# Patient Record
Sex: Male | Born: 1955 | Race: White | Hispanic: No | Marital: Married | Smoking: Former smoker
Health system: Southern US, Community
[De-identification: ages and names within clinical notes are randomized; demographics above are authoritative.]

## PROBLEM LIST (undated history)

## (undated) DIAGNOSIS — E785 Hyperlipidemia, unspecified: Secondary | ICD-10-CM

## (undated) DIAGNOSIS — K5730 Diverticulosis of large intestine without perforation or abscess without bleeding: Secondary | ICD-10-CM

## (undated) DIAGNOSIS — I2510 Atherosclerotic heart disease of native coronary artery without angina pectoris: Secondary | ICD-10-CM

## (undated) DIAGNOSIS — I10 Essential (primary) hypertension: Secondary | ICD-10-CM

## (undated) HISTORY — DX: Diverticulosis of large intestine without perforation or abscess without bleeding: K57.30

## (undated) HISTORY — DX: Hyperlipidemia, unspecified: E78.5

## (undated) HISTORY — PX: OTHER SURGICAL HISTORY: SHX169

## (undated) HISTORY — DX: Atherosclerotic heart disease of native coronary artery without angina pectoris: I25.10

## (undated) HISTORY — DX: Essential (primary) hypertension: I10

---

## 2005-07-12 ENCOUNTER — Ambulatory Visit: Payer: Self-pay | Admitting: Family Medicine

## 2005-09-15 ENCOUNTER — Ambulatory Visit: Payer: Self-pay | Admitting: Family Medicine

## 2005-09-20 ENCOUNTER — Ambulatory Visit: Payer: Self-pay | Admitting: Family Medicine

## 2006-03-01 ENCOUNTER — Emergency Department (HOSPITAL_COMMUNITY): Admission: EM | Admit: 2006-03-01 | Discharge: 2006-03-01 | Payer: Self-pay | Admitting: Emergency Medicine

## 2006-03-02 ENCOUNTER — Ambulatory Visit: Payer: Self-pay | Admitting: Family Medicine

## 2006-03-03 ENCOUNTER — Ambulatory Visit: Payer: Self-pay | Admitting: Family Medicine

## 2006-03-06 ENCOUNTER — Ambulatory Visit: Payer: Self-pay | Admitting: Family Medicine

## 2007-03-31 DIAGNOSIS — I10 Essential (primary) hypertension: Secondary | ICD-10-CM | POA: Insufficient documentation

## 2007-03-31 DIAGNOSIS — K5730 Diverticulosis of large intestine without perforation or abscess without bleeding: Secondary | ICD-10-CM | POA: Insufficient documentation

## 2007-03-31 DIAGNOSIS — E785 Hyperlipidemia, unspecified: Secondary | ICD-10-CM | POA: Insufficient documentation

## 2007-04-02 ENCOUNTER — Ambulatory Visit: Payer: Self-pay | Admitting: Family Medicine

## 2007-04-02 DIAGNOSIS — R079 Chest pain, unspecified: Secondary | ICD-10-CM | POA: Insufficient documentation

## 2007-04-17 ENCOUNTER — Ambulatory Visit: Payer: Self-pay | Admitting: Internal Medicine

## 2007-05-21 ENCOUNTER — Ambulatory Visit: Payer: Self-pay | Admitting: Family Medicine

## 2007-05-21 LAB — CONVERTED CEMR LAB
AST: 31 units/L (ref 0–37)
Albumin: 4.2 g/dL (ref 3.5–5.2)
Basophils Absolute: 0 10*3/uL (ref 0.0–0.1)
Bilirubin Urine: NEGATIVE
Bilirubin, Direct: 0.1 mg/dL (ref 0.0–0.3)
Blood in Urine, dipstick: NEGATIVE
Calcium: 10.1 mg/dL (ref 8.4–10.5)
Chloride: 107 meq/L (ref 96–112)
Cholesterol: 168 mg/dL (ref 0–200)
Eosinophils Absolute: 0.1 10*3/uL (ref 0.0–0.6)
GFR calc Af Amer: 90 mL/min
GFR calc non Af Amer: 74 mL/min
Glucose, Urine, Semiquant: NEGATIVE
HDL: 50.8 mg/dL (ref >39.0)
Ketones, urine, test strip: NEGATIVE
Lymphocytes Relative: 26 % (ref 12.0–46.0)
MCHC: 34.5 g/dL (ref 30.0–36.0)
MCV: 95.9 fL (ref 78.0–100.0)
Neutro Abs: 4.5 10*3/uL (ref 1.4–7.7)
Neutrophils Relative %: 64.9 % (ref 43.0–77.0)
PSA: 0.48 ng/mL (ref 0.10–4.00)
Platelets: 295 10*3/uL (ref 150–400)
Protein, U semiquant: NEGATIVE
RBC: 3.83 M/uL — ABNORMAL LOW (ref 4.22–5.81)
Sodium: 143 meq/L (ref 135–145)
Specific Gravity, Urine: 1.015
Total CHOL/HDL Ratio: 3.3
Triglycerides: 105 mg/dL (ref 0–149)

## 2007-05-28 ENCOUNTER — Ambulatory Visit: Payer: Self-pay | Admitting: Family Medicine

## 2008-02-05 ENCOUNTER — Telehealth: Payer: Self-pay | Admitting: *Deleted

## 2008-03-03 ENCOUNTER — Ambulatory Visit: Payer: Self-pay | Admitting: Family Medicine

## 2008-03-03 DIAGNOSIS — T7840XA Allergy, unspecified, initial encounter: Secondary | ICD-10-CM | POA: Insufficient documentation

## 2008-08-05 ENCOUNTER — Ambulatory Visit: Payer: Self-pay | Admitting: Family Medicine

## 2008-08-05 DIAGNOSIS — J069 Acute upper respiratory infection, unspecified: Secondary | ICD-10-CM | POA: Insufficient documentation

## 2008-11-03 ENCOUNTER — Telehealth: Payer: Self-pay | Admitting: Family Medicine

## 2008-11-03 ENCOUNTER — Ambulatory Visit: Payer: Self-pay | Admitting: Family Medicine

## 2008-11-03 DIAGNOSIS — M79609 Pain in unspecified limb: Secondary | ICD-10-CM | POA: Insufficient documentation

## 2009-10-16 ENCOUNTER — Ambulatory Visit: Payer: Self-pay | Admitting: Internal Medicine

## 2009-10-16 DIAGNOSIS — I209 Angina pectoris, unspecified: Secondary | ICD-10-CM | POA: Insufficient documentation

## 2009-10-19 ENCOUNTER — Ambulatory Visit: Payer: Self-pay | Admitting: Cardiovascular Disease

## 2009-10-21 LAB — CONVERTED CEMR LAB
BUN: 17 mg/dL (ref 6–23)
Chloride: 103 meq/L (ref 96–112)
GFR calc non Af Amer: 73.69 mL/min (ref >60)
HCT: 42.2 % (ref 39.0–52.0)
Hemoglobin: 14.1 g/dL (ref 13.0–17.0)
MCV: 98.5 fL (ref 78.0–100.0)
Platelets: 269 10*3/uL (ref 150.0–400.0)
Potassium: 3.9 meq/L (ref 3.5–5.1)
RBC: 4.28 M/uL (ref 4.22–5.81)
Sodium: 139 meq/L (ref 135–145)
WBC: 13.8 10*3/uL — ABNORMAL HIGH (ref 4.5–10.5)
aPTT: 32.3 s — ABNORMAL HIGH (ref 21.7–28.8)

## 2009-10-22 ENCOUNTER — Ambulatory Visit (HOSPITAL_COMMUNITY): Admission: RE | Admit: 2009-10-22 | Discharge: 2009-10-23 | Payer: Self-pay | Admitting: Cardiovascular Disease

## 2009-10-22 ENCOUNTER — Ambulatory Visit: Payer: Self-pay | Admitting: Cardiovascular Disease

## 2009-10-22 DIAGNOSIS — I2510 Atherosclerotic heart disease of native coronary artery without angina pectoris: Secondary | ICD-10-CM

## 2009-10-22 HISTORY — DX: Atherosclerotic heart disease of native coronary artery without angina pectoris: I25.10

## 2009-10-26 ENCOUNTER — Encounter: Payer: Self-pay | Admitting: Cardiovascular Disease

## 2009-11-09 ENCOUNTER — Ambulatory Visit: Payer: Self-pay | Admitting: Cardiovascular Disease

## 2009-11-09 DIAGNOSIS — I2510 Atherosclerotic heart disease of native coronary artery without angina pectoris: Secondary | ICD-10-CM | POA: Insufficient documentation

## 2009-11-12 ENCOUNTER — Encounter: Payer: Self-pay | Admitting: Cardiovascular Disease

## 2009-12-15 ENCOUNTER — Ambulatory Visit: Payer: Self-pay | Admitting: Family Medicine

## 2009-12-16 ENCOUNTER — Ambulatory Visit: Payer: Self-pay | Admitting: Family Medicine

## 2009-12-25 ENCOUNTER — Ambulatory Visit (HOSPITAL_COMMUNITY): Admission: RE | Admit: 2009-12-25 | Discharge: 2009-12-25 | Payer: Self-pay | Admitting: Orthopaedic Surgery

## 2009-12-25 ENCOUNTER — Ambulatory Visit: Payer: Self-pay | Admitting: Vascular Surgery

## 2010-05-14 ENCOUNTER — Telehealth (INDEPENDENT_AMBULATORY_CARE_PROVIDER_SITE_OTHER): Payer: Self-pay | Admitting: Cardiology

## 2010-05-14 ENCOUNTER — Ambulatory Visit: Payer: Self-pay | Admitting: Cardiovascular Disease

## 2010-05-20 ENCOUNTER — Ambulatory Visit: Payer: Self-pay | Admitting: Family Medicine

## 2010-05-20 LAB — CONVERTED CEMR LAB
Bilirubin Urine: NEGATIVE
Blood in Urine, dipstick: NEGATIVE
Glucose, Urine, Semiquant: NEGATIVE
Ketones, urine, test strip: NEGATIVE
Protein, U semiquant: NEGATIVE
Specific Gravity, Urine: 1.015
WBC Urine, dipstick: NEGATIVE
pH: 7

## 2010-05-25 LAB — CONVERTED CEMR LAB
BUN: 15 mg/dL (ref 6–23)
Basophils Absolute: 0.1 10*3/uL (ref 0.0–0.1)
Bilirubin, Direct: 0.1 mg/dL (ref 0.0–0.3)
Cholesterol: 227 mg/dL — ABNORMAL HIGH (ref 0–200)
Creatinine, Ser: 1.1 mg/dL (ref 0.4–1.5)
Eosinophils Relative: 1.4 % (ref 0.0–5.0)
GFR calc non Af Amer: 72.02 mL/min (ref >60)
Glucose, Bld: 88 mg/dL (ref 70–99)
HCT: 41 % (ref 39.0–52.0)
HDL: 66.1 mg/dL (ref >39.00)
Lymphs Abs: 2.6 10*3/uL (ref 0.7–4.0)
MCV: 97.2 fL (ref 78.0–100.0)
Monocytes Absolute: 0.7 10*3/uL (ref 0.1–1.0)
Monocytes Relative: 9 % (ref 3.0–12.0)
Neutrophils Relative %: 54.7 % (ref 43.0–77.0)
PSA: 0.38 ng/mL (ref 0.10–4.00)
Platelets: 272 10*3/uL (ref 150.0–400.0)
Potassium: 4.4 meq/L (ref 3.5–5.1)
RDW: 13.6 % (ref 11.5–14.6)
TSH: 3.17 microintl units/mL (ref 0.35–5.50)
Total Bilirubin: 0.7 mg/dL (ref 0.3–1.2)
Triglycerides: 198 mg/dL — ABNORMAL HIGH (ref 0.0–149.0)
VLDL: 39.6 mg/dL (ref 0.0–40.0)
WBC: 7.7 10*3/uL (ref 4.5–10.5)

## 2010-07-09 ENCOUNTER — Ambulatory Visit: Payer: Self-pay | Admitting: Family Medicine

## 2010-07-09 LAB — CONVERTED CEMR LAB
Cholesterol: 225 mg/dL — ABNORMAL HIGH (ref 0–200)
HDL: 64.5 mg/dL (ref >39.00)
Triglycerides: 75 mg/dL (ref 0.0–149.0)

## 2010-07-20 ENCOUNTER — Ambulatory Visit: Payer: Self-pay | Admitting: Cardiovascular Disease

## 2010-07-29 DIAGNOSIS — I2510 Atherosclerotic heart disease of native coronary artery without angina pectoris: Secondary | ICD-10-CM

## 2010-07-29 HISTORY — DX: Atherosclerotic heart disease of native coronary artery without angina pectoris: I25.10

## 2010-08-11 ENCOUNTER — Inpatient Hospital Stay (HOSPITAL_COMMUNITY)
Admission: EM | Admit: 2010-08-11 | Discharge: 2010-08-12 | Payer: Self-pay | Source: Home / Self Care | Attending: Internal Medicine | Admitting: Internal Medicine

## 2010-08-13 ENCOUNTER — Telehealth: Payer: Self-pay | Admitting: Cardiovascular Disease

## 2010-08-19 ENCOUNTER — Encounter: Payer: Self-pay | Admitting: Physician Assistant

## 2010-08-27 ENCOUNTER — Encounter: Payer: Self-pay | Admitting: Family Medicine

## 2010-08-27 ENCOUNTER — Encounter: Payer: Self-pay | Admitting: Physician Assistant

## 2010-08-27 ENCOUNTER — Ambulatory Visit: Payer: Self-pay | Admitting: Physician Assistant

## 2010-08-27 DIAGNOSIS — R5383 Other fatigue: Secondary | ICD-10-CM

## 2010-08-27 DIAGNOSIS — R5381 Other malaise: Secondary | ICD-10-CM | POA: Insufficient documentation

## 2010-09-06 LAB — CONVERTED CEMR LAB
HCT: 38.4 % — ABNORMAL LOW (ref 39.0–52.0)
Hemoglobin: 13.2 g/dL (ref 13.0–17.0)
MCHC: 34.4 g/dL (ref 30.0–36.0)
MCV: 92.5 fL (ref 78.0–100.0)
Platelets: 323 10*3/uL (ref 150–400)
RDW: 12.8 % (ref 11.5–15.5)

## 2010-09-19 NOTE — H&P (Addendum)
Jeff Hawkins, Jeff Hawkins                 ACCOUNT NO.:  [id number]  MEDICAL RECORD NO.:  [id number]          PATIENT TYPE:  OBV  LOCATION:  2924                         FACILITY:  [facility]  PHYSICIAN:  Bevelyn Buckles. Bensimhon, MDDATE OF BIRTH:  13-Oct-1955  DATE OF ADMISSION:  08/11/2010 DATE OF DISCHARGE:                             HISTORY & PHYSICAL   PRIMARY CARDIOLOGIST:  Verne Carrow, MD  PRIMARY CARE PHYSICIAN:  Eugenio Hoes. Tawanna Cooler, MD  CHIEF COMPLAINTS:  Chest pain.  HISTORY OF PRESENT ILLNESS:  Jeff Hawkins is a 55 year old Caucasian male with a known history of coronary artery disease, having had a cardiac catheterization secondary to complaints of chest pain in February 2011, found to have a 99% proximal - mid LAD lesion with subsequent PCI using 3.5 x 18 mm Promus DES and no other significant coronary artery disease except for 80% ostial lesion and a small ramus intermedius, risk factors of hypertension, hyperlipidemia, and positive family history, who now presents with chest discomfort, first noted at work in the setting of significant stress.  The patient has been in his usual state of health which includes regular exercise running 30 minutes or so 3-4 times a week without complaint of chest pain or shortness of breath until today.  At work approximately 9:30 a.m., the patient noted the onset of mild left-sided chest discomfort that began to worsen while he was planning his mother-in- law's funeral arrangements.  The patient notes that at first he thought he might have some reflux or have some musculoskeletal pain, but as it continued to became more and more, concerned it might be a cardiac etiology, called his wife and was convinced to come home and take a break from work.  On his drive home, he felt hot although he was not diaphoretic.  He felt more and more short of breath and his chest pain worsened.  He rolled then his window with some relief.  By the time he got home, he  said he had severe left-sided chest discomfort.  The patient discussed these symptoms with both his wife and his son who is finishing his residency and planning to become a cardiologist and was instructed to call EMS ASAP.  The patient was then instructed to lie down and rest, but he felt like he could not.  He was too short of breath with lying down, so he got up which made him felt better. Eventually, he sat outside and waited for EMS.  Initial EKG on their arrival shows sinus tachycardia at 105 bpm, of note the patient is nearly always in low normal sinus or sinus bradycardia in the 50s.  He has no acute ST changes on that tracing or any other tracings completed since.  He was noted to be hypertensive, had systolic BP peak in the 160s and diastolic 110.  He took sublingual nitroglycerin with some relief and when he arrived to the emergency department, he was given nitro paste and reports more relief.  The blood pressure is ranging now from within normal limits with slightly elevated and pulse ranging in the 50s-70s.  Chest x-ray shows no active  disease.  Point-of-care markers negative.  Symptoms significantly improved but not totally resolved.  Note:  The patient, on July 20, 2010, instructed to decrease aspirin to low dose but continued full strength given large supply at home.  PAST MEDICAL HISTORY: 1. CAD.     a.     LHC, February 2011:  99% proximal to mid LAD, small ramus      intermedius with 80% ostial stenosis, circumflex without      significant disease, RCA dominant with serial 20% lesions.  Normal      left ventricular systolic function, EF 55%.     b.     S/P PCI with 3.5 x 18 mm Promus drug-eluting stent to      proximal - mid LAD lesion. 2. Hypertension. 3. Hyperlipidemia. 4. Colonic diverticulosis. 5. (Ear implant stapes replacement).  SOCIAL HISTORY:  The patient lives in [place] with his wife.  He has remote tobacco abuse, quitting in 2003 (13-pack-year  history), 1-2 drinks per day but no binge drinking.  No illicit drug use.  No herbal meds.  Generally follows heart-healthy diet with 2-3 indiscretions per week.  Exercises 3-4 times per week, 30 minutes at a time (running on elliptical).  FAMILY HISTORY:  Father diagnosed with coronary artery disease at age 58 and had CABG at that time.  Brother with diagnosis of CAD with PCI at age 72.  REVIEW OF SYSTEMS:  Please see HPI.  Also, the patient has a significant increased stress lately with his company being merged in an impending move to location as yet unknown at the first of the year.  His mother-in- law has also passed away recently and he has been planning her funeral arrangements.  She denies any exertional dyspnea or chest pain as above. He does note PND, and his wife describes him snoring.  She says that he will be snoring and have breaks in his breath and then sit up and gasp for air.  He did have some mild nausea today, but no vomiting and no recent episodes like this.  No bleeding or dark stools.  No fevers or chills.  No recent coughing or wheezing.  No lower extremity edema.  The patient had some tachy palpitations on his way home, otherwise no recent palpitations.  No syncope but did have mild presyncope during today's episode, otherwise no recent event like this.  No change in bowel or bladder.  No abdominal discomfort.  All other systems were reviewed and were negative.  CODE STATUS:  Full.  ALLERGIES:  Intolerant to BETA BLOCKERS (fatigue), otherwise all NKDA.  MEDICATIONS: 1. Enteric-coated aspirin 325 mg p.o. daily. 2. Crestor 20 mg p.o. daily. 3. Effient 10 mg p.o. daily. 4. Lisinopril 40 mg p.o. daily. 5. Norvasc 5 mg p.o. daily. 6. Sublingual glycerin 0.4 mg q.5 minutes up to 3 doses p.r.n. for     chest discomfort. 7. Multivitamin daily.  PHYSICAL EXAMINATION:  VITAL SIGNS:  Temp 98.2 degrees Fahrenheit, BP 136-144/85-87, peak while in the patient's room,  systolic in the 160s, peak diastolic 110, pulse 58-72, respiration rate 15-16, O2 saturation 96% on 2 liters by nasal cannula. GENERAL:  The patient is alert and oriented x3, in no apparent stress, able to speak easily without respiratory distress. HEENT:  Head is normocephalic, atraumatic.  Pupils equal, round, and reactive to light.  Extraocular muscles are intact.  Nares are patent. Dentition is good.  Oropharynx without erythema or exudate. NECK:  Supple without lymphadenopathy.  No thyromegaly.  No JVD.  No bruits. HEART:  Heart rate is regular with audible S1 and S2.  No clicks, rubs, murmurs, or gallops.  Pulses are 2+ and equal in both upper and lower extremities bilaterally. LUNGS:  Clear to auscultation bilaterally. SKIN:  No rashes, lesions, or petechiae. ABDOMEN:  Soft, nontender, nondistended.  Normal abdominal bowel sounds. No rebound or guarding.  No hepatosplenomegaly.  The patient is overweight. EXTREMITIES:  No clubbing, cyanosis, or edema. MUSCULOSKELETAL:  Without joint deformity or effusions.  No spine or CVA tenderness. NEUROLOGIC:  Cranial nerves II-XII grossly intact.  Strength was 5/5 in all extremities and axial groups.  Normal sensation throughout.  Normal cerebellar function.  RADIOLOGY: 1. Chest x-ray showed no active disease. 2. EKG, rhythm sinus tachycardia, 105 bpm, incomplete right bundle-     branch block, nonspecific ST-T wave changes.  No significant Q-     waves, normal axis, no evidence of hypertrophy.  PR 136, QRS 102,     and QTc of 420.  Compared to a tracing completed in February 2011,     there has been no significant changes except for the rate.  LABS:  WBC is 7.2 with normal differential, HGB 13.0, HCT 37.5, PLT count 275.  Sodium 140, potassium 4.1, chloride 104, bicarb 25, BUN 12, creatinine 1.01, glucose 105.  Point-of-care markers negative x1.  ASSESSMENT AND PLAN:  The patient was initially seen by Lenard Galloway, PAC, and then  seen and thoroughly assessed by attending cardiologist, Dr. Arvilla Meres.  Mr. Simien is a 55 year old Caucasian male with above-noted complex medical history including coronary artery disease, having had diagnostic cardiac catheterization in February 2011, revealing 99% proximal - mid LAD stenosis, subsequently getting 3.5 x 18 mm Promus DES to that lesion and no other significant CAD except for a small ramus intermedius branch (80% ostial lesion), risk factors including moderately well-controlled hypertension, hyperlipidemia, positive family history, and likely obstructive sleep apnea, who now presents with atypical chest discomfort with some typical features without any objective evidence for cardiac etiology in the setting of significant increased stress at home and work.  Suspect chest pain is likely secondary to severe anxiety.  As above, no objective evidence of ischemia.  Given recent stent, would bring in for rule out of myocardial infarction with serial cardiac enzymes. Discharged home with stress test ASAP as the patient has a race at [employer] this weekend and would hope to clear for this race if stress test normal.  Would consider starting Zoloft on discharge at 25 mg p.o. daily x3 days, then 50 mg p.o. daily x2 weeks, then 100 mg p.o. daily thereafter.     Jarrett Ables, PAC   ______________________________ Bevelyn Buckles. Bensimhon, MD    MS/MEDQ  D:  08/11/2010  T:  08/12/2010  Job:  [id number]  Electronically Signed by Jarrett Ables PAC on 09/08/2010 10:48:59 AM Electronically Signed by Arvilla Meres MD on 09/19/2010 07:09:25 PM

## 2010-09-22 ENCOUNTER — Encounter: Payer: Self-pay | Admitting: Pulmonary Disease

## 2010-09-22 ENCOUNTER — Ambulatory Visit (HOSPITAL_BASED_OUTPATIENT_CLINIC_OR_DEPARTMENT_OTHER)
Admission: RE | Admit: 2010-09-22 | Discharge: 2010-09-22 | Payer: Self-pay | Source: Home / Self Care | Attending: Physician Assistant | Admitting: Physician Assistant

## 2010-09-28 NOTE — Cardiovascular Report (Signed)
Summary: Cath/Percutaneous Orders  Cath/Percutaneous Orders   Imported By: Roderic Ovens 10/22/2009 15:46:10  _____________________________________________________________________  External Attachment:    Type:   Image     Comment:   External Document

## 2010-09-28 NOTE — Assessment & Plan Note (Signed)
Summary: Jeff Hawkins   Visit Type:  Jeff Hawkins Primary Provider:  Roderick Pee MD  CC:  epigastric pain....edema/ankles......  History of Present Illness: 55 yo WM with history of HTN, hyperlipidemia here today for follow up. He was seen as a new patient in February 2011 for further evaluation of chest pain. He described left sided chest pressure with jogging. Diagnostic left heart cath was performed on 10/22/09 and he was found to have a severe stenosis in the proximal to mid LAD.  A drug eluting stent was placed in the LAD. He is here today as an add on for evaluation of his blood pressure. He has been checking his BP at home and it has been as high as 160/110. He has overall been feeling well. Occasional heartburn. No exertional chest pain. His HR has been in the 40s on the Toprol and he has felt fatigued.   Lipids one week ago with total chol: 225, LDL 153. HDL 64.     Current Medications (verified): 1)  Aspirin Ec 325 Mg Tbec (Aspirin) .... Take One Tablet By Mouth Daily 2)  Zocor 40 Mg  Tabs (Simvastatin) .Marland Kitchen.. 1 Tab @ Bedtime 3)  Nitrostat 0.4 Mg Subl (Nitroglycerin) .Marland Kitchen.. 1 Tablet Under Tongue At Onset of Chest Pain; You May Repeat Every 5 Minutes For Up To 3 Doses. 4)  Effient 10 Mg Tabs (Prasugrel Hcl) .Marland Kitchen.. 1 Tab Once Daily 5)  Metoprolol Succinate 25 Mg Xr24h-Tab (Metoprolol Succinate) .... 1/2 Tab Every Other Day 6)  Centrum Cardio  Tabs (Multiple Vitamins-Minerals) .... As Needed 7)  Lisinopril 40 Mg Tabs (Lisinopril) .Marland Kitchen.. 1 Tab Once Daily  Allergies (verified): No Known Drug Allergies  Past History:  Past Medical History: Reviewed history from 11/09/2009 and no changes required. CAD, cath 10/22/09 with 99% proximal to mid LAD, s/p DES x 1.  Hyperlipidemia Hypertension Diverticulosis, colon Ear implant-stapes replacement  Social History: Reviewed history from 10/19/2009 and no changes required. Married, 4 children (ages 73-29) Remote tobacco 1990-2003. None fo r8 years.  Alcohol  use-yes, 1-2 drinks per day Drug use-no Regular exercise-yes  Review of Systems       The patient complains of heartburn.  The patient denies fatigue, malaise, fever, weight gain/loss, vision loss, decreased hearing, hoarseness, chest pain, palpitations, shortness of breath, prolonged cough, wheezing, sleep apnea, coughing up blood, abdominal pain, blood in stool, nausea, vomiting, diarrhea, incontinence, blood in urine, muscle weakness, joint pain, leg swelling, rash, skin lesions, headache, fainting, dizziness, depression, anxiety, enlarged lymph nodes, easy bruising or bleeding, and environmental allergies.    Vital Signs:  Patient profile:   55 year old male Height:      72 inches Weight:      210.8 pounds BMI:     28.69 Pulse rate:   62 / minute Pulse rhythm:   regular BP sitting:   132 / 84  (left arm) Cuff size:   large  Vitals Entered By: Danielle Rankin, CMA (July 20, 2010 3:12 PM)  Physical Exam  General:  General: Well developed, well nourished, NAD Musculoskeletal: Muscle strength 5/5 all ext Psychiatric: Mood and affect normal Neck: No JVD, no carotid bruits, no thyromegaly, no lymphadenopathy. Lungs:Clear bilaterally, no wheezes, rhonci, crackles CV: RRR no murmurs, gallops rubs Abdomen: soft, NT, ND, BS present Extremities: No edema, pulses 2+.    Impression & Recommendations:  Problem # 1:  CAD, NATIVE VESSEL (ICD-414.01) Stable. No exertional angina. Continue ASA but will lower to 81 mg per day. Will continue Effient  for now. Will d/c beta blocker as he is not tolerating well. Ace-inhibitor at current dose.  Will change statin to Crestor for better control.   The following medications were removed from the medication list:    Metoprolol Succinate 25 Mg Xr24h-tab (Metoprolol succinate) .Marland Kitchen... Take half tablet by mouth daily    Lisinopril 40 Mg Tabs (Lisinopril) .Marland Kitchen... 1 & 1/2 qam His updated medication list for this problem includes:    Aspirin 81 Mg Tbec  (Aspirin) .Marland Kitchen... Take one tablet by mouth daily    Nitrostat 0.4 Mg Subl (Nitroglycerin) .Marland Kitchen... 1 tablet under tongue at onset of chest pain; you may repeat every 5 minutes for up to 3 doses.    Effient 10 Mg Tabs (Prasugrel hcl) .Marland Kitchen... 1 tab once daily    Lisinopril 40 Mg Tabs (Lisinopril) .Marland Kitchen... 1 tab once daily    Amlodipine Besylate 5 Mg Tabs (Amlodipine besylate) .Marland Kitchen... Take one tablet by mouth daily  Problem # 2:  HYPERTENSION (ICD-401.9) Will d/c Toprol. Will add Norvasc 5 mg by mouth Qdaily for better Bp control.   The following medications were removed from the medication list:    Metoprolol Succinate 25 Mg Xr24h-tab (Metoprolol succinate) .Marland Kitchen... Take half tablet by mouth daily    Lisinopril 40 Mg Tabs (Lisinopril) .Marland Kitchen... 1 & 1/2 qam His updated medication list for this problem includes:    Aspirin 81 Mg Tbec (Aspirin) .Marland Kitchen... Take one tablet by mouth daily    Lisinopril 40 Mg Tabs (Lisinopril) .Marland Kitchen... 1 tab once daily    Amlodipine Besylate 5 Mg Tabs (Amlodipine besylate) .Marland Kitchen... Take one tablet by mouth daily  Problem # 3:  HYPERLIPIDEMIA (ICD-272.4) His goal LDL is 70. Most recent LDL of 153. Will d/c Zocor and will add Crestor 20 mg by mouth at bedtime. I will give him samples today to see how he tolerates it. Recheck lipids and LFTs in 12 weeks.   His updated medication list for this problem includes:    Crestor 20 Mg Tabs (Rosuvastatin calcium) .Marland Kitchen... Take one tablet by mouth daily.  Patient Instructions: 1)  Your physician recommends that you schedule a follow-up appointment in: 6 months 2)  Your physician recommends that you return for a FASTING lipid profile and liver function  in 12 weeks.  3)  Your physician has recommended you make the following change in your medication: STOP taking Metoprolol. DECREASE aspirin to 81mg  by mouth daily, ADD NORVASC 5mg  by mouth daily, and STOP ZOCOR and SWITCH to CRESTOR 20mg  by mouth daily.  Prescriptions: CRESTOR 20 MG TABS (ROSUVASTATIN CALCIUM)  Take one tablet by mouth daily.  #30 x 8   Entered by:   [name] RN   Authorized by:   Verne Carrow, MD   Signed by:   Ellender Hose RN on 07/20/2010   Method used:   Electronically to        [facility]. [address] [phone]* (retail)       [address]       [place], [place]  [address]       Ph: [phone] or [phone]       Fax: [phone]   RxID:   [id number] AMLODIPINE BESYLATE 5 MG TABS (AMLODIPINE BESYLATE) Take one tablet by mouth daily  #30 x 8   Entered by:   [name] RN   Authorized by:   Verne Carrow, MD   Signed by:   Whitney  Maeola Sarah RN on 07/20/2010   Method used:   Electronically to        [facility]. [address]. [phone]* (retail)       [address]       [place], [place]  [address]       Ph: [phone] or [phone]       Fax: [phone]   RxID:   [id number]

## 2010-09-28 NOTE — Assessment & Plan Note (Signed)
Summary: cpx/pt getting labs done by Cardiologist/cjr   Vital Signs:  Patient profile:   55 year old male Height:      72 inches Weight:      209 pounds Temp:     98.2 degrees F oral BP sitting:   142 / 92  (left arm) Cuff size:   regular  Vitals Entered By: Kern Reap CMA Duncan Dull) (May 20, 2010 3:01 PM) CC: cpx Is Patient Diabetic? No Pain Assessment Patient in pain? no        Primary Care Provider:  Roderick Pee MD  CC:  cpx.  History of Present Illness: Jeff Hawkins is a delightful, 55 year old, married male, nonsmoker, who comes in today for general physical examination because of underlying coronary disease, hypertension, hyperlipidemia.  His coronary disease is asymptomatic.  He runs to 3 miles a day, with no chest pain, and good exercise tolerance.  He is currently taking Effient 10 mg daily and an adult aspirin daily.  Is having a lot of trouble with bruising.  Advised to take the aspirin Monday, Wednesday, Friday.  For hypertension.  He takes a beta-blocker 12.5 mg daily, resting pulse today 70.  For hypertension.  He takes 40 mg of lisinopril daily.  BP 142/92, which consistent with 20 scanning at home.  We talked about options.  We will increase her lisinopril from 40 mg a day to 60.  Dipressure monitoring for the next 3 weeks at home to be sure his blood pressure drops and normal.  There is a fair amount of work stress.  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  Past Medical History: Reviewed history from 11/09/2009 and no changes required. CAD, cath 10/22/09 with 99% proximal to mid LAD, s/p DES x 1.  Hyperlipidemia Hypertension Diverticulosis, colon Ear implant-stapes  replacement  Past Surgical History: Reviewed history from 10/19/2009 and no changes required. ear surgeries  R 1986, L 2003 Right knee meniscus removal Left knee surgery remotely  Family History: Reviewed history from 10/19/2009 and no changes required. Family History of CAD Male 1st degree relative <50 Family History Diabetes 1st degree relative Family History Hypertension Mother deceased unknown causes, ? pneumonia Father deceased. H/O CAD, CABG at age 3. 4 sisters alive. No CAD 1 brother, younger. CAD with stent age 61  Social History: Reviewed history from 10/19/2009 and no changes required. Married, 4 children (ages 13-29) Remote tobacco 1990-2003. None fo r8 years.  Alcohol use-yes, 1-2 drinks per day Drug use-no Regular exercise-yes  Review of Systems      See HPI       Flu Vaccine Consent Questions     Do you have a history of severe allergic reactions to this vaccine? no    Any prior history of allergic reactions to egg and/or gelatin? no    Do you have a sensitivity to the preservative Thimersol? no    Do you have a past history of Guillan-Barre Syndrome? no    Do you currently have an acute febrile illness? no    Have you ever had a severe reaction to latex? no    Vaccine information given and explained to patient? yes    Are you currently pregnant? no  Lot Number:[id number]   Exp Date:02/26/2011   Site Given  Left Deltoid IM   Physical Exam  General:  Well-developed,well-nourished,in no acute distress; alert,appropriate and cooperative throughout examination Head:  Normocephalic and atraumatic without obvious abnormalities. No apparent alopecia or balding. Eyes:  No corneal or conjunctival inflammation noted. EOMI. Perrla. Funduscopic exam benign, without hemorrhages, exudates or papilledema. Vision grossly normal. Ears:  External ear exam shows no significant lesions or deformities.  Otoscopic examination reveals clear canals, tympanic membranes are  intact bilaterally without bulging, retraction, inflammation or discharge. Hearing is grossly normal bilaterally. Nose:  External nasal examination shows no deformity or inflammation. Nasal mucosa are pink and moist without lesions or exudates. Mouth:  Oral mucosa and oropharynx without lesions or exudates.  Teeth in good repair. Neck:  No deformities, masses, or tenderness noted. Chest Wall:  No deformities, masses, tenderness or gynecomastia noted. Breasts:  No masses or gynecomastia noted Lungs:  Normal respiratory effort, chest expands symmetrically. Lungs are clear to auscultation, no crackles or wheezes. Heart:  Normal rate and regular rhythm. S1 and S2 normal without gallop, murmur, click, rub or other extra sounds. Abdomen:  Bowel sounds positive,abdomen soft and non-tender without masses, organomegaly or hernias noted. Rectal:  No external abnormalities noted. Normal sphincter tone. No rectal masses or tenderness. Genitalia:  Testes bilaterally descended without nodularity, tenderness or masses. No scrotal masses or lesions. No penis lesions or urethral discharge. Prostate:  Prostate gland firm and smooth, no enlargement, nodularity, tenderness, mass, asymmetry or induration. Msk:  No deformity or scoliosis noted of thoracic or lumbar spine.   Pulses:  R and L carotid,radial,femoral,dorsalis pedis and posterior tibial pulses are full and equal bilaterally Extremities:  No clubbing, cyanosis, edema, or deformity noted with normal full range of motion of all joints.   Neurologic:  No cranial nerve deficits noted. Station and gait are normal. Plantar reflexes are down-going bilaterally. DTRs are symmetrical throughout. Sensory, motor and coordinative functions appear intact. Skin:  total body skin exam normal except for a red lesion on his nose.  Referred to dermatology Cervical Nodes:  No lymphadenopathy noted Axillary Nodes:  No palpable lymphadenopathy Inguinal Nodes:  No significant  adenopathy Psych:  Cognition and judgment appear intact. Alert and cooperative with normal attention span and concentration. No apparent delusions, illusions, hallucinations   Impression & Recommendations:  Problem # 1:  CAD, NATIVE VESSEL (ICD-414.01) Assessment Improved  His updated medication list for this problem includes:    Aspirin Ec 325 Mg Tbec (Aspirin) .Marland Kitchen... Take one tablet by mouth daily    Nitrostat 0.4 Mg Subl (Nitroglycerin) ..... One sublingually as needed for chest pain    Effient 10 Mg Tabs (Prasugrel hcl) .Marland Kitchen... 1 tab once daily    Metoprolol Succinate 25 Mg Xr24h-tab (Metoprolol succinate) .Marland Kitchen... Take half tablet by mouth daily    Lisinopril 40 Mg Tabs (Lisinopril) .Marland Kitchen... 1 & 1/2 qam  Orders: Venipuncture (16109) TLB-Lipid Panel (80061-LIPID) TLB-BMP (Basic Metabolic Panel-BMET) (80048-METABOL) TLB-CBC Platelet - w/Differential (85025-CBCD) TLB-Hepatic/Liver Function Pnl (80076-HEPATIC) TLB-TSH (Thyroid Stimulating Hormone) (84443-TSH) TLB-PSA (Prostate Specific Antigen) (84153-PSA) Prescription Created Electronically [phone]) UA Dipstick w/o Micro (automated)  (81003) Specimen Handling (09811)  Problem # 2:  PHYSICAL EXAMINATION (ICD-V70.0) Assessment: Unchanged  Orders: Venipuncture (91478) TLB-Lipid Panel (80061-LIPID) TLB-BMP (Basic Metabolic Panel-BMET) (80048-METABOL) TLB-CBC Platelet - w/Differential (85025-CBCD) TLB-Hepatic/Liver Function Pnl (80076-HEPATIC) TLB-TSH (Thyroid Stimulating Hormone) (84443-TSH) TLB-PSA (Prostate Specific Antigen) (84153-PSA) Prescription Created Electronically [phone]) UA Dipstick w/o Micro (automated)  (81003) Specimen Handling (13086)  Problem #  3:  HYPERTENSION (ICD-401.9) Assessment: Improved  His updated medication list for this problem includes:    Metoprolol Succinate 25 Mg Xr24h-tab (Metoprolol succinate) .Marland Kitchen... Take half tablet by mouth daily    Lisinopril 40 Mg Tabs (Lisinopril) .Marland Kitchen... 1 & 1/2  qam  Orders: Venipuncture (52841) TLB-Lipid Panel (80061-LIPID) TLB-BMP (Basic Metabolic Panel-BMET) (80048-METABOL) TLB-CBC Platelet - w/Differential (85025-CBCD) TLB-Hepatic/Liver Function Pnl (80076-HEPATIC) TLB-TSH (Thyroid Stimulating Hormone) (84443-TSH) TLB-PSA (Prostate Specific Antigen) (84153-PSA) Prescription Created Electronically [phone]) UA Dipstick w/o Micro (automated)  (81003)  Problem # 4:  HYPERLIPIDEMIA (ICD-272.4) Assessment: Improved  His updated medication list for this problem includes:    Zocor 40 Mg Tabs (Simvastatin) .Marland Kitchen... 1 tab @ bedtime  Orders: Venipuncture (10272) TLB-Lipid Panel (80061-LIPID) TLB-BMP (Basic Metabolic Panel-BMET) (80048-METABOL) TLB-CBC Platelet - w/Differential (85025-CBCD) TLB-Hepatic/Liver Function Pnl (80076-HEPATIC) TLB-TSH (Thyroid Stimulating Hormone) (84443-TSH) TLB-PSA (Prostate Specific Antigen) (84153-PSA) Prescription Created Electronically [phone]) UA Dipstick w/o Micro (automated)  (81003) Specimen Handling (40347)  Complete Medication List: 1)  Aspirin Ec 325 Mg Tbec (Aspirin) .... Take one tablet by mouth daily 2)  Zocor 40 Mg Tabs (Simvastatin) .Marland Kitchen.. 1 tab @ bedtime 3)  Nitrostat 0.4 Mg Subl (Nitroglycerin) .... One sublingually as needed for chest pain 4)  Effient 10 Mg Tabs (Prasugrel hcl) .Marland Kitchen.. 1 tab once daily 5)  Metoprolol Succinate 25 Mg Xr24h-tab (Metoprolol succinate) .... Take half tablet by mouth daily 6)  Centrum Cardio Tabs (Multiple vitamins-minerals) .... Take 1 tablet by mouth two times a day 7)  Lisinopril 40 Mg Tabs (Lisinopril) .Marland Kitchen.. 1 & 1/2 qam  Other Orders: Admin 1st Vaccine (42595) Flu Vaccine 60yrs + (63875)  Patient Instructions: 1)  Please schedule a follow-up appointment in 1 year. 2)  increase the lisinopril to 60 mg daily check a morning blood pressure daily for 3 weeks and fax me the day than at [phone]. 3)  Decrease the aspirin to one tablet Monday, Wednesday,  Friday Prescriptions: METOPROLOL SUCCINATE 25 MG XR24H-TAB (METOPROLOL SUCCINATE) take half tablet by mouth daily  #50 x 3   Entered and Authorized by:   Roderick Pee MD   Signed by:   Roderick Pee MD on 05/20/2010   Method used:   Electronically to        [facility] [address] [phone]* (retail)       [address]       [place], [place]  [address]       Ph: [phone] or [phone]       Fax: [phone]   RxID:   [id number] EFFIENT 10 MG TABS (PRASUGREL HCL) 1 tab once daily  #100 x 3   Entered and Authorized by:   Roderick Pee MD   Signed by:   Roderick Pee MD on 05/20/2010   Method used:   Electronically to        [facility] [address] [phone]* (retail)       [address]       [place], [place]  [address]       Ph: [phone] or [phone]       Fax: [phone]   RxID:   [id number] NITROSTAT 0.4 MG SUBL (NITROGLYCERIN) one sublingually as needed for chest pain  #25 x 1   Entered and Authorized by:   Roderick Pee MD   Signed by:   Roderick Pee MD on 05/20/2010  Method used:   Electronically to        [facility]. [address] [phone]* (retail)       [address]       [place], [place]  [address]       Ph: [phone] or [phone]       Fax: [phone]   RxID:   [id number] ZOCOR 40 MG  TABS (SIMVASTATIN) 1 tab @ bedtime  #100 x 3   Entered and Authorized by:   Roderick Pee MD   Signed by:   Roderick Pee MD on 05/20/2010   Method used:   Electronically to        [facility] [address] [phone]* (retail)       [address]       [place], [place]  [address]       Ph: [phone] or [phone]       Fax: [phone]   RxID:   [id number] LISINOPRIL 40 MG TABS (LISINOPRIL) 1 & 1/2 qam  #150 x 3   Entered and Authorized by:   Roderick Pee MD   Signed by:   Roderick Pee MD on 05/20/2010   Method used:   Electronically to        [facility] [address] [phone]*  (retail)       [address]       [place], [place]  [address]       Ph: [phone] or [phone]       Fax: [phone]   RxID:   [id number]     Laboratory Results   Urine Tests  Date/Time Recieved: May 20, 2010 5:23 PM  Date/Time Reported: May 20, 2010 5:23 PM   Routine Urinalysis   Color: yellow Appearance: Clear Glucose: negative   (Normal Range: Negative) Bilirubin: negative   (Normal Range: Negative) Ketone: negative   (Normal Range: Negative) Spec. Gravity: 1.015   (Normal Range: 1.003-1.035) Blood: negative   (Normal Range: Negative) pH: 7.0   (Normal Range: 5.0-8.0) Protein: negative   (Normal Range: Negative) Urobilinogen: 0.2   (Normal Range: 0-1) Nitrite: negative   (Normal Range: Negative) Leukocyte Esterace: negative   (Normal Range: Negative)    Comments: Wynona Canes, CMA  May 20, 2010 5:23 PM

## 2010-09-28 NOTE — Miscellaneous (Signed)
Summary: [facility] Progress Note  [facility] Note   Imported By: Roderic Ovens 12/02/2009 13:50:20  _____________________________________________________________________  External Attachment:    Type:   Image     Comment:   External Document

## 2010-09-28 NOTE — Assessment & Plan Note (Signed)
Summary: rov @ 4pm ok per pat/sl   Visit Type:  Follow-up Primary Provider:  Roderick Pee MD  CC:  no cardiac complaints today.  History of Present Illness: 55 yo WM with history of HTN, hyperlipidemia here today for follow up. He was seen as a new patient last month for further evaluation of chest pain. He described left sided chest pressure with jogging over the prior 4-6 weeks. Diagnostic left heart cath was performed on 10/22/09 and he was found to have a severe stenosis in the proximal to mid LAD.  A drug eluting stent was placed in the LAD. The right radial artery was used for access. He has done well since discharge. He has had no chest pain, SOB or palpitations. He has started back jogging without problems.   Current Medications (verified): 1)  Lisinopril 20 Mg Tabs (Lisinopril) .Marland Kitchen.. 1 Tab Once Daily 2)  Aspirin 81 Mg  Tbec (Aspirin) .... Once Daily 3)  Zocor 40 Mg  Tabs (Simvastatin) .Marland Kitchen.. 1 Tab @ Bedtime 4)  Nitrostat 0.4 Mg Subl (Nitroglycerin) .... One Sublingually As Needed For Chest Pain 5)  Effient 10 Mg Tabs (Prasugrel Hcl) .Marland Kitchen.. 1 Tab Once Daily  Allergies (verified): No Known Drug Allergies  Past History:  Past Medical History: CAD, cath 10/22/09 with 99% proximal to mid LAD, s/p DES x 1.  Hyperlipidemia Hypertension Diverticulosis, colon Ear implant-stapes replacement  Social History: Reviewed history from 10/19/2009 and no changes required. Married, 4 children (ages 16-29) Remote tobacco 1990-2003. None fo r8 years.  Alcohol use-yes, 1-2 drinks per day Drug use-no Regular exercise-yes  Review of Systems  The patient denies fatigue, malaise, fever, weight gain/loss, vision loss, decreased hearing, hoarseness, chest pain, palpitations, shortness of breath, prolonged cough, wheezing, sleep apnea, coughing up blood, abdominal pain, blood in stool, nausea, vomiting, diarrhea, heartburn, incontinence, blood in urine, muscle weakness, joint pain, leg swelling, rash,  skin lesions, headache, fainting, dizziness, depression, anxiety, enlarged lymph nodes, easy bruising or bleeding, and environmental allergies.    Vital Signs:  Patient profile:   55 year old male Height:      72 inches Weight:      200 pounds BMI:     27.22 Pulse rate:   58 / minute Pulse rhythm:   regular BP sitting:   110 / 80  (left arm) Cuff size:   large  Vitals Entered By: Danielle Rankin, CMA (November 09, 2009 3:37 PM)  Physical Exam  General:  General: Well developed, well nourished, NAD Psychiatric: Mood and affect normal Neck: No JVD, no carotid bruits, no thyromegaly, no lymphadenopathy. Lungs:Clear bilaterally, no wheezes, rhonci, crackles CV: RRR no murmurs, gallops rubs Abdomen: soft, NT, ND, BS present Extremities: No edema, pulses 2+.    Cardiac Cath  Procedure date:  10/22/2009  Findings:      ANGIOGRAPHIC FINDINGS: 1. The left main coronary artery had no disease. 2. The left anterior descending was a large-caliber vessel that     wrapped to the apex and gave off a moderate-to-large sized first     diagonal branch and a small second diagonal branch.  There was a     99% discrete stenosis in the proximal to mid vessel at the takeoff     of a moderate-sized septal perforator.  This did not involve the     moderate-sized diagonal branch.  There was 30-40% plaque just prior     to this discrete lesion in the proximal vessel.  The moderate-sized  first diagonal branch had no disease. 3. The circumflex artery was a large-caliber vessel that gave off     large obtuse marginal branch that was free of any disease.  The mid     AV groove circumflex had minor luminal irregularities.  There was a     ramus intermediate branch that was small in size and had an ostial     80% stenosis. 4. The right coronary artery was a large dominant vessel that had     serial 20% lesions throughout the proximal and midportions.  There     were no obstructive lesions in this  vessel. 5. Left ventricular angiogram was performed in the RAO projection     showed normal left ventricular systolic function with ejection     fraction of 55%.  No mitral regurgitation was noted.   IMPRESSION: 1. Single-vessel coronary artery disease. 2. Normal left ventricular systolic function. 3. Successful percutaneous transluminal coronary angioplasty with     placement of a drug-eluting stent in the proximal to mid left     anterior descending coronary artery.    Impression & Recommendations:  Problem # 1:  CAD, NATIVE VESSEL (ICD-414.01) Single vessel CAD s/p placement Promus DES in the proximal to mid LAD. He has done well. He has mild residual disease in other vessels. Will continue ASA and Effient for at least one year. Continue Lisinopril. Will start low dose beta blocker (Toprol XL 12.5mg  by mouth Qdaily). He was bradycardic in the hospital with heart rates in the 40's. I did not send him home on a beta blocker because of the bradycardia. His heart ratre is in the high 50's today so we will try. He will follow his pulse and if it is consistently less than 45 or any dizziness, will have him stop the Toprol. He is to continue advancing his activity level.   The following medications were removed from the medication list:    Metoprolol Succinate 50 Mg Xr24h-tab (Metoprolol succinate) ..... One daily His updated medication list for this problem includes:    Lisinopril 20 Mg Tabs (Lisinopril) .Marland Kitchen... 1 tab once daily    Aspirin Ec 325 Mg Tbec (Aspirin) .Marland Kitchen... Take one tablet by mouth daily    Nitrostat 0.4 Mg Subl (Nitroglycerin) ..... One sublingually as needed for chest pain    Effient 10 Mg Tabs (Prasugrel hcl) .Marland Kitchen... 1 tab once daily    Metoprolol Succinate 25 Mg Xr24h-tab (Metoprolol succinate) .Marland Kitchen... Take half tablet by mouth daily  Patient Instructions: 1)  Your physician recommends that you schedule a follow-up appointment in: 6 months 2)  Your physician has recommended you  make the following change in your medication: Start toprol 25 mg half tablet by mouth daily Prescriptions: METOPROLOL SUCCINATE 25 MG XR24H-TAB (METOPROLOL SUCCINATE) take half tablet by mouth daily  #15 x 11   Entered by:   Dossie Arbour, RN, BSN   Authorized by:   Verne Carrow, MD   Signed by:   Dossie Arbour, RN, BSN on 11/09/2009   Method used:   Electronically to        [facility]. [address] [phone]* (retail)       [address]       [place], [place]  [address]       Ph: [phone] or [phone]       Fax: [phone]   RxID:   [id number]

## 2010-09-28 NOTE — Progress Notes (Signed)
  Phone Note Outgoing Call   Call placed by: [name] RN,  May 14, 2010 4:05 PM Call placed to: Patient  Follow-up for Phone Call        Called pt. to discuss medication change. Increased Lisinopril to 40mg  by mouth daily.

## 2010-09-28 NOTE — Assessment & Plan Note (Signed)
Summary: per check out/sf   Visit Type:  Follow-up Primary Provider:  Roderick Pee MD  CC:  No cardiac complains.  History of Present Illness: 55 yo WM with history of HTN, hyperlipidemia here today for follow up. He was seen as a new patient in February 2011 for further evaluation of chest pain. He described left sided chest pressure with jogging. Diagnostic left heart cath was performed on 10/22/09 and he was found to have a severe stenosis in the proximal to mid LAD.  A drug eluting stent was placed in the LAD. He has had no chest pain, SOB or palpitations. He has started back jogging without problems. He has been under much stress at work with a pending move to the [place] to relocate his family.   Current Medications (verified): 1)  Lisinopril 20 Mg Tabs (Lisinopril) .Marland Kitchen.. 1 Tab Once Daily 2)  Aspirin Ec 325 Mg Tbec (Aspirin) .... Take One Tablet By Mouth Daily 3)  Zocor 40 Mg  Tabs (Simvastatin) .Marland Kitchen.. 1 Tab @ Bedtime 4)  Nitrostat 0.4 Mg Subl (Nitroglycerin) .... One Sublingually As Needed For Chest Pain 5)  Effient 10 Mg Tabs (Prasugrel Hcl) .Marland Kitchen.. 1 Tab Once Daily 6)  Metoprolol Succinate 25 Mg Xr24h-Tab (Metoprolol Succinate) .... Take Half Tablet By Mouth Daily 7)  Centrum Cardio  Tabs (Multiple Vitamins-Minerals) .... Take 1 Tablet By Mouth Two Times A Day  Allergies (verified): No Known Drug Allergies  Past History:  Past Medical History: Reviewed history from 11/09/2009 and no changes required. CAD, cath 10/22/09 with 99% proximal to mid LAD, s/p DES x 1.  Hyperlipidemia Hypertension Diverticulosis, colon Ear implant-stapes replacement  Social History: Reviewed history from 10/19/2009 and no changes required. Married, 4 children (ages 32-29) Remote tobacco 1990-2003. None fo r8 years.  Alcohol use-yes, 1-2 drinks per day Drug use-no Regular exercise-yes  Review of Systems  The patient denies fatigue, malaise, fever, weight gain/loss, vision loss, decreased hearing,  hoarseness, chest pain, palpitations, shortness of breath, prolonged cough, wheezing, sleep apnea, coughing up blood, abdominal pain, blood in stool, nausea, vomiting, diarrhea, heartburn, incontinence, blood in urine, muscle weakness, joint pain, leg swelling, rash, skin lesions, headache, fainting, dizziness, depression, anxiety, enlarged lymph nodes, easy bruising or bleeding, and environmental allergies.    Vital Signs:  Patient profile:   55 year old male Height:      72 inches Weight:      208 pounds BMI:     28.31 Pulse rate:   58 / minute Pulse rhythm:   regular Resp:     18 per minute BP sitting:   170 / 100  (left arm) Cuff size:   large  Vitals Entered By: Vikki Ports (May 14, 2010 3:14 PM)  Physical Exam  General:  General: Well developed, well nourished, NAD HEENT: OP clear, mucus membranes moist Musculoskeletal: Muscle strength 5/5 all ext Psychiatric: Mood and affect normal Neck: No JVD, no carotid bruits, no thyromegaly, no lymphadenopathy. Lungs:Clear bilaterally, no wheezes, rhonci, crackles CV: RRR no murmurs, gallops rubs Abdomen: soft, NT, ND, BS present Extremities: No edema, pulses 2+.    EKG  Procedure date:  05/14/2010  Findings:      Sinus bradycardia, rate 58 bpm. Incomplete RBBB  Impression & Recommendations:  Problem # 1:  CAD, NATIVE VESSEL (ICD-414.01) Stable. No angina. He is tolerating his medications well. He is bradycardic on low dose Toprol but asymptomatic.  No changes. Continue dual antiplatelet therapy with ASA and Effient.   His updated  medication list for this problem includes:    Lisinopril 20 Mg Tabs (Lisinopril) .Marland Kitchen... 1 tab once daily    Aspirin Ec 325 Mg Tbec (Aspirin) .Marland Kitchen... Take one tablet by mouth daily    Nitrostat 0.4 Mg Subl (Nitroglycerin) ..... One sublingually as needed for chest pain    Effient 10 Mg Tabs (Prasugrel hcl) .Marland Kitchen... 1 tab once daily    Metoprolol Succinate 25 Mg Xr24h-tab (Metoprolol succinate)  .Marland Kitchen... Take half tablet by mouth daily    Lisinopril 40 Mg Tabs (Lisinopril) .Marland Kitchen... Take one tablet by mouth daily  Problem # 2:  HYPERTENSION (ICD-401.9) Uncontrolled. Will increase Lisinopril to 40 mg per day. Will have him come back in one week for BMET, fasting lipids and LFTs.   His updated medication list for this problem includes:    Lisinopril 20 Mg Tabs (Lisinopril) .Marland Kitchen... 1 tab once daily    Aspirin Ec 325 Mg Tbec (Aspirin) .Marland Kitchen... Take one tablet by mouth daily    Metoprolol Succinate 25 Mg Xr24h-tab (Metoprolol succinate) .Marland Kitchen... Take half tablet by mouth daily    Lisinopril 40 Mg Tabs (Lisinopril) .Marland Kitchen... Take one tablet by mouth daily  Patient Instructions: 1)  Your physician recommends that you schedule a follow-up appointment in: 6 months 2)  Your physician recommends that you return for a FASTING lipid profile, liver function test, and  BMP in 1 week. 3)  Your physician has recommended you make the following change in your medication: INCREASE your Lisinopril to 40mg  by mouth daily. Prescriptions: LISINOPRIL 40 MG TABS (LISINOPRIL) Take one tablet by mouth daily  #30 x 8   Entered by:   [name] RN   Authorized by:   Verne Carrow, MD   Signed by:   Ellender Hose RN on 05/14/2010   Method used:   Electronically to        [facility]. [address] [phone]* (retail)       [address]       [place], [place]  [address]       Ph: [phone] or [phone]       Fax: [phone]   RxID:   [id number]

## 2010-09-28 NOTE — Miscellaneous (Signed)
Summary: Physician Order/Treatment Plan   Physician Order/Treatment Plan   Imported By: Roderic Ovens 11/06/2009 16:19:35  _____________________________________________________________________  External Attachment:    Type:   Image     Comment:   External Document

## 2010-09-28 NOTE — Letter (Signed)
Summary: Cardiac Catheterization Instructions- Main Lab  [facility], Main Office  [address]   [place], [place] [address]   Phone: [phone]  Fax: [phone]     10/19/2009 MRN: [id number]  Jeff Hawkins [address] [place], [place]  [address]  Dear Jeff Hawkins,   You are scheduled for Cardiac Catheterization on    October 22, 2009           with Dr.      Clifton James      .  Please arrive at the [facility] at  6:30     a.m.Marland Kitchen on the day of your procedure.  1. DIET     __x__ Nothing to eat or drink after midnight except your medications with a sip of water.  2. Come to the [place] office on             for lab work.  The lab at [facility] is open from 8:30 a.m. to 1:30 p.m. and 2:30 p.m. to 5:00 p.m.  The lab at [facility] is open from 7:30 a.m. to 5:30 p.m.  You do not have to be fasting.  3. MAKE SURE YOU TAKE YOUR ASPIRIN.  4. _____ DO NOT TAKE these medications before your procedure:         ________________________________________________________________________________      ____ YOU MAY TAKE ALL of your remaining medications with a small amount of water.      ____ START NEW medications:     ________________________________________________________________________________      ____ Jeff Hawkins instructions:     ________________________________________________________________________________  5. Plan for one night stay - bring personal belongings (i.e. toothpaste, toothbrush, etc.)  6. Bring a current list of your medications and current insurance cards.  7. Must have a responsible person to drive you home.   8. Someone must be with yu for the first 24 hours after you arrive home.  9. Please wear clothes that are easy to get on and off and wear slip-on shoes.  *Special note: Every effort is made to have your procedure done on time.  Occasionally there are emergencies that present themselves at  the hospital that may cause delays.  Please be patient if a delay does occur.  If you have any questions after you get home, please call the office at the number listed above.  Jeff Arbour, RN, BSN

## 2010-09-28 NOTE — Assessment & Plan Note (Signed)
Summary: Jeff Hawkins   Visit Type:  new pt visit Primary Provider:  Roderick Pee MD  CC:  chest pain raidiating down left arm...edema/hands.  History of Present Illness: 55 yo WM with history of HTN, hyperlipidemia here today for further evaluatoin of chest pain. He describes left sided chest pressure with jogging over the last 4-6 weeks. The pain is described as a heaviness or pressure. There is radiatoin into the left shoulder and arm. This resolves with rest. There is no associated SOB, palpitations. He does feel dizzy at times. He wears a heart rate monitor and this pain usually starts when his heart rate reaches 120 beats per minute. He had an exercise only stress test 2 years ago and had no problems. He reports good control of his blood pressure and cholesterol.   Old records reviewed.   Current Medications (verified): 1)  Hydrochlorothiazide 25 Mg Tabs (Hydrochlorothiazide) .Marland Kitchen.. 1 Once Daily 2)  Lisinopril 40 Mg Tabs (Lisinopril) .Marland Kitchen.. 1 Once Daily 3)  Aspirin 81 Mg  Tbec (Aspirin) .... Once Daily 4)  Zocor 40 Mg  Tabs (Simvastatin) .Marland Kitchen.. 1 Tab @ Bedtime 5)  Metoprolol Succinate 50 Mg Xr24h-Tab (Metoprolol Succinate) .... One Daily 6)  Nitrostat 0.4 Mg Subl (Nitroglycerin) .... One Sublingually As Needed For Chest Pain  Allergies (verified): No Known Drug Allergies  Past History:  Past Medical History: Hyperlipidemia Hypertension Diverticulosis, colon Ear implant-stapes replacement  Past Surgical History: ear surgeries  R 1986, L 2003 Right knee meniscus removal Left knee surgery remotely  Family History: Family History of CAD Male 1st degree relative <50 Family History Diabetes 1st degree relative Family History Hypertension Mother deceased unknown causes, ? pneumonia Father deceased. H/O CAD, CABG at age 8. 4 sisters alive. No CAD 1 brother, younger. CAD with stent age 79  Social History: Married, 4 children (ages 46-29) Remote tobacco 1990-2003.  None fo r8 years.  Alcohol use-yes, 1-2 drinks per day Drug use-no Regular exercise-yes  Review of Systems       The patient complains of chest pain and dizziness.  The patient denies fatigue, malaise, fever, weight gain/loss, vision loss, decreased hearing, hoarseness, palpitations, shortness of breath, prolonged cough, wheezing, sleep apnea, coughing up blood, abdominal pain, blood in stool, nausea, vomiting, diarrhea, heartburn, incontinence, blood in urine, muscle weakness, joint pain, leg swelling, rash, skin lesions, headache, fainting, depression, anxiety, enlarged lymph nodes, easy bruising or bleeding, and environmental allergies.    Vital Signs:  Patient profile:   55 year old male Height:      72 inches Weight:      202 pounds BMI:     27.50 Pulse rate:   47 / minute Pulse rhythm:   irregular BP sitting:   102 / 70  (left arm) Cuff size:   large  Vitals Entered By: Danielle Rankin, CMA (October 19, 2009 3:41 PM)  Physical Exam  General:  General: Well developed, well nourished, NAD HEENT: OP clear, mucus membranes moist SKIN: warm, dry Neuro: No focal deficits Musculoskeletal: Muscle strength 5/5 all ext Psychiatric: Mood and affect normal Neck: No JVD, no carotid bruits, no thyromegaly, no lymphadenopathy. Lungs:Clear bilaterally, no wheezes, rhonci, crackles CV: RRR no murmurs, gallops rubs Abdomen: soft, NT, ND, BS present Extremities: No edema, pulses 2+.    EKG  Procedure date:  10/19/2009  Findings:      Sinus bradycardia, rate 47 bpm. Incomplete RBBB. Septal Q waves, possible septal infarction.   Impression & Recommendations:  Problem # 1:  CHEST PAIN (ICD-786.50) This patient has symptoms that  are concerning for unstable angina. He has a history of HTN and hyperlipidemia with recent onset of exertional chest pressure that resolves with rest. He also has a strong family history of CAD. I will arrange a diagnostic left heart cath for Thursday February  24th at 8:30 am in the main cath lab. There is a high probability that he will have obstructive disease. I have reviewed the risks and benefits of the procedure. He is in agreement to proceed. We will check BMET, CBC and coags today.   His updated medication list for this problem includes:    Lisinopril 40 Mg Tabs (Lisinopril) .Marland Kitchen... 1 once daily    Aspirin 81 Mg Tbec (Aspirin) ..... Once daily    Metoprolol Succinate 50 Mg Xr24h-tab (Metoprolol succinate) ..... One daily    Nitrostat 0.4 Mg Subl (Nitroglycerin) ..... One sublingually as needed for chest pain  Orders: EKG w/ Interpretation (93000) TLB-BMP (Basic Metabolic Panel-BMET) (80048-METABOL) TLB-CBC Platelet - w/Differential (85025-CBCD) TLB-PT (Protime) (85610-PTP) TLB-PTT (85730-PTTL)  Patient Instructions: 1)  Your physician recommends that you schedule a follow-up appointment in: 3 weeks 2)  Your physician has requested that you have a cardiac catheterization.  Cardiac catheterization is used to diagnose and/or treat various heart conditions. Doctors may recommend this procedure for a number of different reasons. The most common reason is to evaluate chest pain. Chest pain can be a symptom of coronary artery disease (CAD), and cardiac catheterization can show whether plaque is narrowing or blocking your heart's arteries. This procedure is also used to evaluate the valves, as well as measure the blood flow and oxygen levels in different parts of your heart.  For further information please visit [email or web address]  Please follow instruction sheet, as given.

## 2010-09-28 NOTE — Assessment & Plan Note (Signed)
Summary: pain in leg muscles/cjr   Vital Signs:  Patient profile:   55 year old male Weight:      204 pounds Temp:     98.1 degrees F oral BP sitting:   152 / 90  (left arm) Cuff size:   regular  Vitals Entered By: Kern Reap CMA Duncan Dull) (December 15, 2009 4:08 PM) CC: right calf pain   Primary Care Provider:  Roderick Pee MD  CC:  right calf pain.  History of Present Illness: Jeff Hawkins is a 55 year old male, who comes in today for evaluation of pain in his right lower extremity for 10 days.  On April the 10th Sunday he get up and went for his normal job.  He noticed pain in the right mid tibia.  It was so painful.  He had to stop.  No history of trauma.  He's not been able to run since then because of the pain.  He is on effient because he had a stent put in.  No history of blood clots, chest pain, shortness of breath, etc.  Allergies: No Known Drug Allergies  Family History: Reviewed history from 10/19/2009 and no changes required. Family History of CAD Male 1st degree relative <50 Family History Diabetes 1st degree relative Family History Hypertension Mother deceased unknown causes, ? pneumonia Father deceased. H/O CAD, CABG at age 36. 4 sisters alive. No CAD 1 brother, younger. CAD with stent age 62  Social History: Reviewed history from 10/19/2009 and no changes required. Married, 4 children (ages 10-29) Remote tobacco 1990-2003. None fo r8 years.  Alcohol use-yes, 1-2 drinks per day Drug use-no Regular exercise-yes  Review of Systems      See HPI  Physical Exam  General:  Well-developed,well-nourished,in no acute distress; alert,appropriate and cooperative throughout examination Msk:  the legs appear normal.  There is some tenderness in the mid shaft of the tibia medially.  No calf tenderness.  No redness.  No heat Pulses:  R and L carotid,radial,femoral,dorsalis pedis and posterior tibial pulses are full and equal bilaterally Extremities:  No clubbing,  cyanosis, edema, or deformity noted with normal full range of motion of all joints.     Impression & Recommendations:  Problem # 1:  LEG PAIN, RIGHT (ICD-729.5) Assessment New  Orders: T-Tib/Fib Right (73590TC)  Complete Medication List: 1)  Lisinopril 20 Mg Tabs (Lisinopril) .Marland Kitchen.. 1 tab once daily 2)  Aspirin Ec 325 Mg Tbec (Aspirin) .... Take one tablet by mouth daily 3)  Zocor 40 Mg Tabs (Simvastatin) .Marland Kitchen.. 1 tab @ bedtime 4)  Nitrostat 0.4 Mg Subl (Nitroglycerin) .... One sublingually as needed for chest pain 5)  Effient 10 Mg Tabs (Prasugrel hcl) .Marland Kitchen.. 1 tab once daily 6)  Metoprolol Succinate 25 Mg Xr24h-tab (Metoprolol succinate) .... Take half tablet by mouth daily 7)  Vicodin Es 7.5-750 Mg Tabs (Hydrocodone-acetaminophen) .... 1/2 to 1 q 4h as needed  Patient Instructions: 1)  elevation and ice 15 minutes 3 or 4 times per day.  He may take a half of a Vicodin every 4 to 6 hours as needed for severe pain.  Go to the main office in the morning at 9 a.m. for an x-ray of the leg.  I will call u   The report Prescriptions: VICODIN ES 7.5-750 MG TABS (HYDROCODONE-ACETAMINOPHEN) 1/2 to 1 q 4h as needed  #30 x 1   Entered and Authorized by:   Roderick Pee MD   Signed by:   Roderick Pee MD on  12/15/2009   Method used:   Print then Give to Patient   RxID:   [id number]

## 2010-09-28 NOTE — Assessment & Plan Note (Signed)
Summary: consult re: left shoulder injury/cjr   Vital Signs:  Patient profile:   55 year old male Weight:      204 pounds Temp:     98.6 degrees F BP sitting:   120 / 70  (right arm) Cuff size:   regular  Vitals Entered By: Duard Brady LPN (October 16, 2009 8:17 AM) CC: c.o numbness and tingling in (l) left arm with pain in chest and sob while running Is Patient Diabetic? No   CC:  c.o numbness and tingling in (l) left arm with pain in chest and sob while running.  History of Present Illness: 55 -year-old who has a history of hypertension, and dyslipidemia.  He has a family history of coronary artery disease-his father.  History of CABG.  For the past 3 weeks, he has had predictable chest pain with jogging, but is relieved by rest.  Pain is described as both achiness and tightness with radiation to left arm.  No nausea, dyspnea or diaphoresis.  He did have a negative treadmill ETT approximately 2 years ago.  Preventive Screening-Counseling & Management  Alcohol-Tobacco     Smoking Status: quit  Allergies (verified): No Known Drug Allergies  Past History:  Past Medical History: Reviewed history from 03/31/2007 and no changes required. Hyperlipidemia Hypertension Diverticulosis, colon  Social History: Smoking Status:  quit  Review of Systems       The patient complains of chest pain.  The patient denies anorexia, fever, weight loss, weight gain, vision loss, decreased hearing, hoarseness, syncope, dyspnea on exertion, peripheral edema, prolonged cough, headaches, hemoptysis, abdominal pain, melena, hematochezia, severe indigestion/heartburn, hematuria, incontinence, genital sores, muscle weakness, suspicious skin lesions, transient blindness, difficulty walking, depression, unusual weight change, abnormal bleeding, enlarged lymph nodes, angioedema, breast masses, and testicular masses.    Physical Exam  General:  Well-developed,well-nourished,in no acute distress;  alert,appropriate and cooperative throughout examination; 122/78 Head:  Normocephalic and atraumatic without obvious abnormalities. No apparent alopecia or balding. Eyes:  No corneal or conjunctival inflammation noted. EOMI. Perrla. Funduscopic exam benign, without hemorrhages, exudates or papilledema. Vision grossly normal. Mouth:  Oral mucosa and oropharynx without lesions or exudates.  Teeth in good repair. Neck:  No deformities, masses, or tenderness noted. Chest Wall:  chest wall pain Lungs:  Normal respiratory effort, chest expands symmetrically. Lungs are clear to auscultation, no crackles or wheezes. Heart:  Normal rate and regular rhythm. S1 and S2 normal without gallop, murmur, click, rub or other extra sounds. Abdomen:  Bowel sounds positive,abdomen soft and non-tender without masses, organomegaly or hernias noted. Msk:  No deformity or scoliosis noted of thoracic or lumbar spine.   Pulses:  R and L carotid,radial,femoral,dorsalis pedis and posterior tibial pulses are full and equal bilaterally Extremities:  No clubbing, cyanosis, edema, or deformity noted with normal full range of motion of all joints.   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 1 once daily    Lisinopril 40 Mg Tabs (Lisinopril) .Marland Kitchen... 1 once daily    Aspirin 81 Mg Tbec (Aspirin) ..... Once daily    Metoprolol Succinate 50 Mg Xr24h-tab (Metoprolol succinate) ..... One daily    Nitrostat 0.4 Mg Subl (Nitroglycerin) ..... One sublingually as needed for chest pain this appears to be classic angina pectoris.  Will continue daily aspirin,  limit his activity, placed  on beta-blocker therapy, and give a prescription for nitroglycerin.  Will discuss with cardiology  who will consider a diagnostic heart catheterization; will  review a 12-lead EKG and a the right and live in a [facility] at [facility] and examined in the cardiologist is not in the area next week with new onset angina and policy every zero.  He [id number] yes lung.  There are no bruits orm and  His updated medication list for this problem includes:    Hydrochlorothiazide 25 Mg Tabs (Hydrochlorothiazide) .Marland Kitchen... 1 once daily    Lisinopril 40 Mg Tabs (Lisinopril) .Marland Kitchen... 1 once daily    Aspirin 81 Mg Tbec (Aspirin) ..... Once daily    Metoprolol Succinate 50 Mg Xr24h-tab (Metoprolol succinate) ..... One daily    Nitrostat 0.4 Mg Subl (Nitroglycerin) ..... One sublingually as needed for chest pain  Orders: EKG w/ Interpretation (93000)  Problem # 2:  HYPERTENSION (ICD-401.9)  His updated medication list for this problem includes:    Hydrochlorothiazide 25 Mg Tabs (Hydrochlorothiazide) .Marland Kitchen... 1 once daily    Lisinopril 40 Mg Tabs (Lisinopril) .Marland Kitchen... 1 once daily    Metoprolol Succinate 50 Mg Xr24h-tab (Metoprolol succinate) ..... One daily  His updated medication list for this problem includes:    Hydrochlorothiazide 25 Mg Tabs (Hydrochlorothiazide) .Marland Kitchen... 1 once daily    Lisinopril 40 Mg Tabs (Lisinopril) .Marland Kitchen... 1 once daily    Metoprolol Succinate 50 Mg Xr24h-tab (Metoprolol succinate) ..... One daily  Orders: EKG w/ Interpretation (93000)  Problem # 3:  HYPERLIPIDEMIA (ICD-272.4)  His updated medication list for this problem includes:    Zocor 40 Mg Tabs (Simvastatin) .Marland Kitchen... 1 tab @ bedtime  His updated medication list for this problem includes:    Zocor 40 Mg Tabs (Simvastatin) .Marland Kitchen... 1 tab @ bedtime  Complete Medication List: 1)  Hydrochlorothiazide 25 Mg Tabs (Hydrochlorothiazide) .Marland Kitchen.. 1 once daily 2)  Lisinopril 40 Mg Tabs (Lisinopril) .Marland Kitchen.. 1 once daily 3)  Aspirin 81 Mg Tbec (Aspirin) .... Once daily 4)  Zocor 40 Mg Tabs (Simvastatin) .Marland Kitchen.. 1 tab @ bedtime 5)  Metoprolol Succinate 50 Mg Xr24h-tab (Metoprolol succinate) .... One daily 6)  Nitrostat  0.4 Mg Subl (Nitroglycerin) .... One sublingually as needed for chest pain  Patient Instructions: 1)  cardiology appointment 3: 15 Monday, February 21 2)  no strenuous activity 3)  report any recurrent chest pain immediately and take nitroglycerin Prescriptions: METOPROLOL SUCCINATE 50 MG XR24H-TAB (METOPROLOL SUCCINATE) one daily  #30 x 0   Entered and Authorized by:   Gordy Savers  MD   Signed by:   Gordy Savers  MD on 10/16/2009   Method used:   Print then Give to Patient   RxID:   [id number] NITROSTAT 0.4 MG SUBL (NITROGLYCERIN) one sublingually as needed for chest pain  #bottle x 0   Entered and Authorized by:   Gordy Savers  MD   Signed by:   Gordy Savers  MD on 10/16/2009   Method used:   Print then Give to Patient   RxID:   [id number] METOPROLOL SUCCINATE 50 MG XR24H-TAB (METOPROLOL SUCCINATE) one daily  #30 x 0   Entered and Authorized by:   Gordy Savers  MD   Signed by:   Gordy Savers  MD on 10/16/2009   Method used:   Electronically to        [facility] [address] [phone]* (retail)       [address]  [place], [place]  [address]       Ph: [phone] or [phone]       Fax: [phone]   RxID:   [id number]

## 2010-09-30 NOTE — Progress Notes (Signed)
Summary: question on meds/***rtn call**  Phone Note Call from Patient Call back at [phone]   Caller: Patient Reason for Call: Talk to Nurse Summary of Call: Pt states dr Clifton James was going to give him some anit anxiety meds.  Initial call taken by: [name],  August 13, 2010 3:06 PM  Follow-up for Phone Call        [facility] Scherrie Bateman, LPN  August 13, 2010 4:01 PM  Additional Follow-up for Phone Call Additional follow up Details #1::        Can we start him on Zoloft 50 mg by mouth Qdaily. thanks, chris  LVMTCB [name] RN  August 16, 2010 2:11 PM  Additional Follow-up by: Verne Carrow, MD,  August 16, 2010 8:37 AM    Additional Follow-up for Phone Call Additional follow up Details #2::    pt is rtning call pls call him at [phone] so he will be able to answer  [name]  August 18, 2010 9:32 AM  adv pt of new prescription. he expressed understanding.  Follow-up by: Claris Gladden RN,  August 18, 2010 9:50 AM  New/Updated Medications: SERTRALINE HCL 50 MG TABS (SERTRALINE HCL) once daily Prescriptions: SERTRALINE HCL 50 MG TABS (SERTRALINE HCL) once daily  #30 x 11   Entered by:   Claris Gladden RN   Authorized by:   Verne Carrow, MD   Signed by:   Claris Gladden RN on 08/18/2010   Method used:   Electronically to        [facility] [address] [phone]* (retail)       [address]       [place], [place]  [address]       Ph: [phone] or [phone]       Fax: [phone]   RxID:   [id number]

## 2010-09-30 NOTE — Miscellaneous (Signed)
  Clinical Lists Changes  Observations: Added new observation of CARDCATHFIND: CONCLUSIONS: 1. Preserved overall left ventricular function with ejection fraction     in excess of 55% and no wall motion abnormality. 2. Continued wide patency of the left anterior descending stent. 3. Continued 80% narrowing of the ostium of the small first diagonal     branch. 4. Scattered coronary plaques including mid LAD, proximal mid right,     and distal right.   DISPOSITION:  Aggressive risk factor reduction is recommended.  He will follow up with Dr. Clifton James.  Because of his positive enzymes, we suggested discharge in the morning, the patient preferred to go home tonight and has asked that he be released.   (08/12/2010 11:45)      Cardiac Cath  Procedure date:  08/12/2010  Findings:      CONCLUSIONS: 1. Preserved overall left ventricular function with ejection fraction     in excess of 55% and no wall motion abnormality. 2. Continued wide patency of the left anterior descending stent. 3. Continued 80% narrowing of the ostium of the small first diagonal     branch. 4. Scattered coronary plaques including mid LAD, proximal mid right,     and distal right.   DISPOSITION:  Aggressive risk factor reduction is recommended.  He will follow up with Dr. Clifton James.  Because of his positive enzymes, we suggested discharge in the morning, the patient preferred to go home tonight and has asked that he be released.

## 2010-09-30 NOTE — Assessment & Plan Note (Signed)
Summary: eph   Visit Type:  [name] Primary Provider:  Roderick Pee MD  CC:  no cardiac complaints today.  History of Present Illness: Primary Cardiologist:  Dr. Verne Hawkins  Jeff Hawkins is a 55 yo male with a history of CAD, status post drug-eluting stent to the mid LAD in February 2011 who was recently admitted with chest pain.  He had elevated CK-MBs but his troponins were negative.  Cardiac catheterization demonstrated a patent stent in the LAD.  He had a septal perforator coming off of the stent with a stable 80% stenosis and an 80% stenosis in a small ramus intermediate.  Medical therapy was pursued.  LDL was 73 during his hospitalization.  Chest x-ray was negative.  He returns today for followup.  Overall, he is doing well.  He denies chest discomfort.  He denies shortness of breath.  He exercises on his elliptical machine daily without angina.  He denies syncope near syncope.  He denies orthopnea or PND.  He denies pedal edema.  He does report symptoms of fatigue.  He denies melena or hematochezia.  Recent TSH was normal.  His wife states that he does snore.  She has also witnessed apneic episodes.  He reports recent history of daytime hypersomnolence.  Current Medications (verified): 1)  Aspirin Ec 325 Mg Tbec (Aspirin) .... Take One Tablet By Mouth Daily 2)  Crestor 20 Mg Tabs (Rosuvastatin Calcium) .... Take One Tablet By Mouth Daily. 3)  Nitrostat 0.4 Mg Subl (Nitroglycerin) .Marland Kitchen.. 1 Tablet Under Tongue At Onset of Chest Pain; You May Repeat Every 5 Minutes For Up To 3 Doses. 4)  Effient 10 Mg Tabs (Prasugrel Hcl) .Marland Kitchen.. 1 Tab Once Daily 5)  Centrum Cardio  Tabs (Multiple Vitamins-Minerals) .... As Needed 6)  Prinivil 20 Mg Tabs (Lisinopril) .Marland Kitchen.. 1 Tab Once Daily 7)  Amlodipine Besylate 5 Mg Tabs (Amlodipine Besylate) .... Take One Tablet By Mouth Daily 8)  Sertraline Hcl 50 Mg Tabs (Sertraline Hcl) .... Once Daily 9)  Nexium 40 Mg Cpdr (Esomeprazole Magnesium) .Marland Kitchen.. 1 Tab Once  Daily  Allergies (verified): No Known Drug Allergies  Past History:  Past Medical History: CAD, cath 10/22/09 with 99% proximal to mid LAD, s/p DES x 1.    a. cath 07/2010: septal perf 80%; RI (small) 80%; LAD stent ok; med tx. Hyperlipidemia Hypertension Diverticulosis, colon Ear implant-stapes replacement Intol to beta blockers due to fatigue  Review of Systems       As per  the HPI.  All other systems reviewed and negative.   Vital Signs:  Patient profile:   55 year old male Height:      72 inches Weight:      209 pounds BMI:     28.45 Pulse rate:   52 / minute Pulse rhythm:   irregular BP sitting:   138 / 80  (left arm) Cuff size:   regular  Vitals Entered By: Jeff Hawkins, CMA (August 27, 2010 2:48 PM)  Physical Exam  General:  Well nourished, well developed, in no acute distress HEENT: normal Neck: no JVD Cardiac:  normal S1, S2; RRR; no murmur Lungs:  clear to auscultation bilaterally, no wheezing, rhonchi or rales Abd: soft, nontender, no hepatomegaly Ext: no  edema; Right Radial site without hematoma or bruit Vascular: no carotid  bruits Skin: warm and dry Neuro:  CNs 2-12 intact, no focal abnormalities noted    EKG  Procedure date:  08/27/2010  Findings:      Sinus  Bradycardia Heart rate 52 Normal axis Incomplete right bundle-branch block No ischemic changes  Impression & Recommendations:  Problem # 1:  CAD, NATIVE VESSEL (ICD-414.01)  He is very active without angina. Continue medical therapy. Follow up with Dr. Clifton Hawkins in 6 mos.  Orders: EKG w/ Interpretation (93000)  Problem # 2:  HYPERTENSION (ICD-401.9)  Controlled.  Problem # 3:  HYPERLIPIDEMIA (ICD-272.4) LDL now 73. Continue current meds.  LFTs ok in hospital.  His updated medication list for this problem includes:    Crestor 20 Mg Tabs (Rosuvastatin calcium) .Marland Kitchen... Take one tablet by mouth daily.  Problem # 4:  FATIGUE (ICD-780.79)  He has a h/o snoring and  witnessed apneic episodes. We will set him up for a sleep study. Check CBC today to r/o anemia. TSH normal in hosp recently. Sinus bradycardia is not new and stable.  Orders: T-CBC No Diff ([id number]) Sleep Disorder Referral (Sleep Disorder)  Patient Instructions: 1)  Your physician recommends that you return for lab work in: TODAY FOR CBC FOR FATIGUE 780.79 2)  Your physician recommends that you schedule a follow-up appointment in: KEEP YOUR APPOINTMENT WITH DR. Clifton Hawkins THAT IS ALREADY SCHEDULED.  3)  Your physician has recommended that you have a sleep study.  Jeff WEAVER, PA WOULD LIKE FOR THIS TO BE A SPLIT NIGHT STUDY FOR FATIGUE. This test records several body functions during sleep, including:  brain activity, eye movement, oxygen and carbon dioxide blood levels, heart rate and rhythm, breathing rate and rhythm, the flow of air through your mouth and nose, snoring, body muscle movements, and chest and belly movement. 4)  Your physician recommends that you continue on your current medications as directed. Please refer to the Current Medication list given to you today.

## 2010-10-01 ENCOUNTER — Telehealth: Payer: Self-pay | Admitting: Physician Assistant

## 2010-10-04 ENCOUNTER — Telehealth: Payer: Self-pay | Admitting: Physician Assistant

## 2010-10-05 ENCOUNTER — Telehealth: Payer: Self-pay | Admitting: Physician Assistant

## 2010-10-07 DIAGNOSIS — I491 Atrial premature depolarization: Secondary | ICD-10-CM

## 2010-10-07 DIAGNOSIS — G4733 Obstructive sleep apnea (adult) (pediatric): Secondary | ICD-10-CM

## 2010-10-11 ENCOUNTER — Other Ambulatory Visit: Payer: Self-pay

## 2010-10-14 NOTE — Progress Notes (Signed)
Summary: Please reschedule sleep test-  Phone Note Outgoing Call   Summary of Call: Left message for patient to call back. His sleep test was incomplete because he did not sleep through the night.  Dr. Shelle Iron sent me a message that he could do a repeat test while taking a sleep aid.  I reviewed my note and it sounds like he had significant symptoms of sleep apnea. Please ask the patient if he has ever taken [place].  If he has never had a reaction or adverse effect to it, we will need to get him a Rx and rescheduled for the sleep test (split night).   I would want him to get Ambien 5 mg.  He can take 1-2 at bedtime before his test.  Rx should be for #2 only with no refills.  He will have to see his PCP in the future if he wants something permanent for sleep. Thanks Initial call taken by: Brynda Rim,  October 01, 2010 2:32 PM  Follow-up for Phone Call        see phone note 10/04/10 Follow-up by: Tereso Newcomer PA-C,  October 04, 2010 5:47 PM

## 2010-10-14 NOTE — Progress Notes (Signed)
Summary: returning your call- LVMTCB*  Phone Note Call from Patient   Reason for Call: Talk to Doctor Summary of Call:  pt returning your. pt # [phone] Initial call taken by: [name],  October 04, 2010 11:12 AM  Follow-up for Phone Call        LVMTCB [name] RN  October 04, 2010 12:48 PM  Pt returningcall Judie Grieve  October 04, 2010 1:21 PM  Spoke to patient. Will fax Ambien to his pharmacy. I am waiting to hear back from Dr. Teddy Spike nurse regarding setting up his sleep study. [name] RN  October 04, 2010 2:16 PM  Per Bjorn Loser @ the Pulmonary office, the sleep study center will set up the appointment for Mr. Dehart. He is aware & will take the Ambien prior to the study. [name] RN  October 04, 2010 3:33 PM   Follow-up by: [name] RN,  October 04, 2010 12:48 PM    New/Updated Medications: AMBIEN 5 MG TABS (ZOLPIDEM TARTRATE) take one tablet by mouth daily. Prescriptions: AMBIEN 5 MG TABS (ZOLPIDEM TARTRATE) take one tablet by mouth daily.  #2 x 0   Entered by:   [name] RN   Authorized by:   Tereso Newcomer PA-C   Signed by:   Ellender Hose RN on 10/04/2010   Method used:   Print then Give to Patient   RxID:   [id number]

## 2010-10-14 NOTE — Progress Notes (Addendum)
Summary: pt's wife calling re sleep study  Phone Note Outgoing Call Call back at Home Phone [phone]   Caller: [phone] spouse nancy Teresi Reason for Call: Talk to Nurse Summary of Call: pt's wife calling re sleep study Initial call taken by: Glynda Jaeger,  October 05, 2010 3:33 PM Summary of Call: CMA s/w pt's wife about repeat Sleep study appt and wanted to know if they could just get the CPAP machine before the repeat sleep study, wife states she is an Charity fundraiser and son is becoming a Development worker, international aid and they know the benefits of the CPAP. CMA explained she might want to call Dr. Nelida Meuse office or even try calling the sleep center and see what they say. Wife gave verbal understanding. Danielle Rankin, CMA  October 05, 2010 4:00 PM       Appended Document: pt's wife calling re sleep study talked with vernon at sleep center...the pt basically refuses to come back to sleep center for testing.  I called and spoke with wife, and offered to do home sleep testing over a weekend he would be at home.  Unlikely to be covered or accepted by insurance due to his comorbid cardiac disease, but the wife states they would purchase cpap on their own outside of insurance if he would benefit after looking at home study.  She will talk to the pt and let me know if he would like to proceed.

## 2010-11-08 LAB — COMPREHENSIVE METABOLIC PANEL
Albumin: 3.5 g/dL (ref 3.5–5.2)
BUN: 10 mg/dL (ref 6–23)
Calcium: 9 mg/dL (ref 8.4–10.5)
Chloride: 109 mEq/L (ref 96–112)
Creatinine, Ser: 0.99 mg/dL (ref 0.4–1.5)
Total Bilirubin: 0.9 mg/dL (ref 0.3–1.2)
Total Protein: 6.5 g/dL (ref 6.0–8.3)

## 2010-11-08 LAB — CARDIAC PANEL(CRET KIN+CKTOT+MB+TROPI)
CK, MB: 8.3 ng/mL (ref 0.3–4.0)
Relative Index: 3.9 — ABNORMAL HIGH (ref 0.0–2.5)
Relative Index: 4.1 — ABNORMAL HIGH (ref 0.0–2.5)
Total CK: 178 U/L (ref 7–232)
Total CK: 204 U/L (ref 7–232)
Troponin I: 0.01 ng/mL (ref 0.00–0.06)
Troponin I: 0.02 ng/mL (ref 0.00–0.06)

## 2010-11-08 LAB — PROTIME-INR: INR: 1 (ref 0.00–1.49)

## 2010-11-08 LAB — LIPID PANEL: VLDL: 23 mg/dL (ref 0–40)

## 2010-11-09 LAB — POCT CARDIAC MARKERS

## 2010-11-09 LAB — CBC
MCH: 32.3 pg (ref 26.0–34.0)
MCV: 93.1 fL (ref 78.0–100.0)
Platelets: 275 10*3/uL (ref 150–400)
RDW: 13 % (ref 11.5–15.5)
WBC: 7.2 10*3/uL (ref 4.0–10.5)

## 2010-11-09 LAB — BASIC METABOLIC PANEL
BUN: 12 mg/dL (ref 6–23)
Chloride: 104 mEq/L (ref 96–112)
Creatinine, Ser: 1.01 mg/dL (ref 0.4–1.5)

## 2010-11-09 LAB — DIFFERENTIAL
Basophils Absolute: 0 10*3/uL (ref 0.0–0.1)
Eosinophils Absolute: 0.1 10*3/uL (ref 0.0–0.7)
Eosinophils Relative: 1 % (ref 0–5)

## 2010-11-19 LAB — CBC
HCT: 39.1 % (ref 39.0–52.0)
Hemoglobin: 13.6 g/dL (ref 13.0–17.0)
MCHC: 34.6 g/dL (ref 30.0–36.0)
RDW: 12.6 % (ref 11.5–15.5)

## 2010-11-19 LAB — BASIC METABOLIC PANEL
CO2: 28 mEq/L (ref 19–32)
Glucose, Bld: 104 mg/dL — ABNORMAL HIGH (ref 70–99)
Potassium: 3.7 mEq/L (ref 3.5–5.1)
Sodium: 139 mEq/L (ref 135–145)

## 2011-01-20 ENCOUNTER — Encounter: Payer: Self-pay | Admitting: *Deleted

## 2011-01-26 ENCOUNTER — Encounter: Payer: Self-pay | Admitting: Cardiovascular Disease

## 2011-01-26 ENCOUNTER — Ambulatory Visit (INDEPENDENT_AMBULATORY_CARE_PROVIDER_SITE_OTHER): Payer: 59 | Admitting: Cardiovascular Disease

## 2011-01-26 VITALS — BP 144/90 | HR 68 | Resp 14 | Ht 73.0 in | Wt 210.0 lb

## 2011-01-26 DIAGNOSIS — I2510 Atherosclerotic heart disease of native coronary artery without angina pectoris: Secondary | ICD-10-CM

## 2011-01-26 MED ORDER — AMLODIPINE BESYLATE 10 MG PO TABS: 10.0000 mg | ORAL_TABLET | Freq: Every day | ORAL | Status: DC

## 2011-01-26 MED ORDER — LISINOPRIL 40 MG PO TABS: 40.0000 mg | ORAL_TABLET | Freq: Every day | ORAL | Status: DC

## 2011-01-26 MED ORDER — CLOPIDOGREL BISULFATE 75 MG PO TABS: 75.0000 mg | ORAL_TABLET | Freq: Every day | ORAL | Status: DC

## 2011-01-26 MED ORDER — ZOLPIDEM TARTRATE 5 MG PO TABS: 5.0000 mg | ORAL_TABLET | Freq: Every evening | ORAL | Status: DC | PRN

## 2011-01-26 NOTE — Patient Instructions (Signed)
Your physician recommends that you schedule a follow-up appointment in: 6 months with Dr. Clifton James  Your physician has recommended you make the following change in your medication: INCREASE NORVASC to 10 mg daily, STOP EFFIENT and START PLAVIX 75 mg daily.

## 2011-01-26 NOTE — Assessment & Plan Note (Signed)
Stable. Will change Effient to Plavix 75 mg po Qdaily. Continue statin.

## 2011-01-26 NOTE — Assessment & Plan Note (Signed)
Increase Norvasc to 10 mg po Qdaily.

## 2011-01-26 NOTE — Progress Notes (Signed)
History of Present Illness:Jeff Hawkins is a 55 yo male with a history of CAD, status post drug-eluting stent to the mid LAD in February 2011, HTN, hyperlipidemia here today for routine cardiac follow up.  He was readmitted fall of 2011 with CP. He had elevated CK-MBs but his troponins were negative.  Cardiac catheterization demonstrated a patent stent in the LAD.  He had a septal perforator coming off of the stent with a stable 80% stenosis and an 80% stenosis in a small ramus intermediate.  Medical therapy was pursued.  LDL was 73 during his hospitalization.  Chest x-ray was negative.    Overall, he is doing well.  He denies chest discomfort.  He denies shortness of breath.   He denies syncope near syncope.  He denies orthopnea or PND.  He denies pedal edema.  He does report symptoms of fatigue.  He denies melena or hematochezia.  Recent TSH was normal.  His wife states that he does snore.  She has also witnessed apneic episodes.  He reports recent history of daytime hypersomnolence. He had a sleep study but was incomplete, suggestive of OSA. He has not followed up with this since. His BP at home has been 135/90 on average.    Past Medical History  Diagnosis Date  . Coronary artery disease 10/22/09     99% proximal to mid LAD, s/p DES x 1.  . CAD (coronary artery disease) 07/2010    septal perf 80%; RI (small) 80%; LAD stent ok; med tx  . Hyperlipidemia   . Hypertension   . Diverticulosis of colon     Past Surgical History  Procedure Date  . Ear implant-stapes replacement   . Ear surgeries R 1986, L 2003  . Right knee meniscus removal   . Left knee surgery remotely     Current Outpatient Prescriptions  Medication Sig Dispense Refill  . amLODipine (NORVASC) 5 MG tablet Take 5 mg by mouth daily.        Marland Kitchen aspirin EC 325 MG tablet Take 325 mg by mouth daily.        Marland Kitchen esomeprazole (NEXIUM) 40 MG capsule Take 40 mg by mouth daily before breakfast.        . lisinopril (PRINIVIL,ZESTRIL) 20 MG  tablet Take 20 mg by mouth daily.        . Multiple Vitamins-Minerals (CENTRUM CARDIO) TABS Take 1 tablet by mouth as needed.        . nitroGLYCERIN (NITROSTAT) 0.4 MG SL tablet Place 0.4 mg under the tongue every 5 (five) minutes as needed.        . prasugrel (EFFIENT) 10 MG TABS Take by mouth.        . rosuvastatin (CRESTOR) 20 MG tablet Take 20 mg by mouth daily.        . sertraline (ZOLOFT) 50 MG tablet Take 50 mg by mouth daily.        Marland Kitchen zolpidem (AMBIEN) 5 MG tablet Take 5 mg by mouth at bedtime as needed.          Allergies not on file  History   Social History  . Marital Status: Married    Spouse Name: N/A    Number of Children: N/A  . Years of Education: N/A   Occupational History  . Not on file.   Social History Main Topics  . Smoking status: Former Smoker    Quit date: 08/29/2001  . Smokeless tobacco: Not on file  . Alcohol Use: Yes  1-2 drinks per day  . Drug Use: No  . Sexually Active:    Other Topics Concern  . Not on file   Social History Narrative   Regular exercise    Family History  Problem Relation Age of Onset  . Coronary artery disease Father   . Coronary artery disease Brother     stent  . Coronary artery disease Other     1st degree male relative  . Hypertension Other   . Diabetes Other     1st degree relative     Review of Systems:  As stated in the HPI and otherwise negative.   BP 144/90  Pulse 68  Resp 14  Ht 6\' 1"  (1.854 m)  Wt 210 lb (95.255 kg)  BMI 27.71 kg/m2  Physical Examination: General: Well developed, well nourished, NAD HEENT: OP clear, mucus membranes moist SKIN: warm, dry. No rashes. Neuro: No focal deficits Musculoskeletal: Muscle strength 5/5 all ext Psychiatric: Mood and affect normal Neck: No JVD, no carotid bruits, no thyromegaly, no lymphadenopathy. Lungs:Clear bilaterally, no wheezes, rhonci, crackles Cardiovascular: Regular rate and rhythm. No murmurs, gallops or rubs. Abdomen:Soft. Bowel sounds  present. Non-tender.  Extremities: No lower extremity edema. Pulses are 2 + in the bilateral DP/PT.

## 2011-04-14 ENCOUNTER — Other Ambulatory Visit: Payer: Self-pay | Admitting: *Deleted

## 2011-04-14 MED ORDER — ROSUVASTATIN CALCIUM 20 MG PO TABS: 20.0000 mg | ORAL_TABLET | Freq: Every day | ORAL | Status: DC

## 2011-05-12 ENCOUNTER — Other Ambulatory Visit: Payer: Self-pay | Admitting: Family Medicine

## 2011-06-13 ENCOUNTER — Other Ambulatory Visit: Payer: Self-pay | Admitting: Family Medicine

## 2011-07-01 ENCOUNTER — Ambulatory Visit (INDEPENDENT_AMBULATORY_CARE_PROVIDER_SITE_OTHER): Payer: BC Managed Care – PPO | Admitting: Family Medicine

## 2011-07-01 ENCOUNTER — Encounter: Payer: Self-pay | Admitting: Family Medicine

## 2011-07-01 VITALS — BP 142/100 | HR 81 | Temp 98.1°F | Wt 213.0 lb

## 2011-07-01 DIAGNOSIS — J40 Bronchitis, not specified as acute or chronic: Secondary | ICD-10-CM

## 2011-07-01 MED ORDER — AZITHROMYCIN 250 MG PO TABS: ORAL_TABLET | ORAL | Status: AC

## 2011-07-01 MED ORDER — HYDROCODONE-HOMATROPINE 5-1.5 MG/5ML PO SYRP: 5.0000 mL | ORAL_SOLUTION | ORAL | Status: AC | PRN

## 2011-07-01 NOTE — Progress Notes (Signed)
  Subjective:    Patient ID: Jeff Hawkins, male    DOB: 27-Nov-1955, 55 y.o.   MRN: [id number]  HPI Here for 2 weeks of chest congestion and coughing up green sputum. No fever. Using Nyquil   Review of Systems  Constitutional: Negative.   HENT: Negative.   Eyes: Negative.   Respiratory: Positive for cough.        Objective:   Physical Exam  Constitutional: He appears well-developed and well-nourished.  HENT:  Right Ear: External ear normal.  Left Ear: External ear normal.  Nose: Nose normal.  Mouth/Throat: Oropharynx is clear and moist. No oropharyngeal exudate.  Eyes: Conjunctivae are normal. Pupils are equal, round, and reactive to light.  Neck: No thyromegaly present.  Pulmonary/Chest: Effort normal. No respiratory distress. He has no wheezes. He has no rales.       Scattered rhonchi  Lymphadenopathy:    He has no cervical adenopathy.          Assessment & Plan:  Recheck prn

## 2011-07-04 ENCOUNTER — Telehealth: Payer: Self-pay | Admitting: Family Medicine

## 2011-07-04 NOTE — Telephone Encounter (Signed)
Ok to work in.
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
## 2011-07-04 NOTE — Telephone Encounter (Signed)
noted 

## 2011-07-04 NOTE — Telephone Encounter (Signed)
Pts spouse called to check on status of getting work in ov with pcp for pt tomorrow 07/05/11. Pt has been sch in 12:30 time slot, but pt is coming in around 10am for work in appt as noted.

## 2011-07-05 ENCOUNTER — Ambulatory Visit: Payer: BC Managed Care – PPO | Admitting: Family Medicine

## 2011-07-18 ENCOUNTER — Ambulatory Visit (INDEPENDENT_AMBULATORY_CARE_PROVIDER_SITE_OTHER): Payer: BC Managed Care – PPO | Admitting: Family Medicine

## 2011-07-18 ENCOUNTER — Encounter: Payer: Self-pay | Admitting: Family Medicine

## 2011-07-18 VITALS — BP 120/90 | Temp 98.0°F | Wt 218.0 lb

## 2011-07-18 DIAGNOSIS — S9000XA Contusion of unspecified ankle, initial encounter: Secondary | ICD-10-CM

## 2011-07-18 DIAGNOSIS — Z23 Encounter for immunization: Secondary | ICD-10-CM
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
## 2011-07-18 NOTE — Patient Instructions (Signed)
Elevation and ice as often as possible.  Return p.r.n.

## 2011-08-31 ENCOUNTER — Other Ambulatory Visit: Payer: Self-pay

## 2011-09-05 MED ORDER — SERTRALINE HCL 50 MG PO TABS: 50.0000 mg | ORAL_TABLET | Freq: Every day | ORAL | Status: DC

## 2011-09-05 NOTE — Progress Notes (Signed)
Addended by: Kern Reap B on: 09/05/2011 01:41 PM   Modules accepted: Orders

## 2011-10-04 ENCOUNTER — Other Ambulatory Visit: Payer: Self-pay | Admitting: Cardiovascular Disease

## 2011-10-04 DIAGNOSIS — I2510 Atherosclerotic heart disease of native coronary artery without angina pectoris: Secondary | ICD-10-CM

## 2011-10-04 MED ORDER — AMLODIPINE BESYLATE 10 MG PO TABS: 10.0000 mg | ORAL_TABLET | Freq: Every day | ORAL | Status: DC

## 2011-10-04 MED ORDER — CLOPIDOGREL BISULFATE 75 MG PO TABS: 75.0000 mg | ORAL_TABLET | Freq: Every day | ORAL | Status: DC

## 2011-11-02 ENCOUNTER — Other Ambulatory Visit: Payer: Self-pay | Admitting: Family Medicine

## 2011-11-24 ENCOUNTER — Other Ambulatory Visit: Payer: Self-pay | Admitting: *Deleted

## 2011-11-28 ENCOUNTER — Other Ambulatory Visit: Payer: Self-pay | Admitting: *Deleted

## 2011-11-28 MED ORDER — LISINOPRIL 40 MG PO TABS: ORAL_TABLET | ORAL | Status: DC

## 2012-01-27 ENCOUNTER — Encounter: Payer: Self-pay | Admitting: Cardiovascular Disease

## 2012-01-27 ENCOUNTER — Ambulatory Visit (INDEPENDENT_AMBULATORY_CARE_PROVIDER_SITE_OTHER): Payer: BC Managed Care – PPO | Admitting: Cardiovascular Disease

## 2012-01-27 VITALS — BP 138/88 | HR 65 | Ht 73.0 in | Wt 213.0 lb

## 2012-01-27 DIAGNOSIS — I2510 Atherosclerotic heart disease of native coronary artery without angina pectoris: Secondary | ICD-10-CM

## 2012-01-27 NOTE — Patient Instructions (Signed)
Your physician wants you to follow-up in: 12 months. You will receive a reminder letter in the mail two months in advance. If you don't receive a letter, please call our office to schedule the follow-up appointment.  Your physician has recommended you make the following change in your medication: Stop Plavix  Your physician recommends that you return for fasting lab work in: next week or so

## 2012-01-27 NOTE — Assessment & Plan Note (Signed)
Stable. Will continue ASA. D/C Plavix. Continue other meds.

## 2012-01-27 NOTE — Progress Notes (Signed)
History of Present Illness: 56 yo WM with a history of CAD, status post drug-eluting stent to the mid LAD in February 2011, HTN, hyperlipidemia here today for routine cardiac follow up. He was readmitted fall of 2011 with CP. He had elevated CK-MBs but his troponins were negative. Cardiac catheterization demonstrated a patent stent in the LAD. He had a septal perforator coming off of the stent with a stable 80% stenosis and an 80% stenosis in a small ramus intermediate. Medical therapy was pursued.  He is here today for follow up. He is doing well.  He denies chest discomfort. He denies shortness of breath. He denies syncope near syncope. He denies orthopnea or PND. He denies pedal edema. He notes that his energy level has not been normal since he had his CAD diagnosed.   Primary Care Physician: Dr. Tawanna Cooler  Last Lipid Profile:  Lipid Panel     Component Value Date/Time   CHOL  Value: 155        ATP III CLASSIFICATION:  <200     mg/dL   Desirable  962-952  mg/dL   Borderline High  >=841    mg/dL   High        32/44/0102 0240   TRIG 114 08/12/2010 0240   HDL 59 08/12/2010 0240   CHOLHDL 2.6 08/12/2010 0240   VLDL 23 08/12/2010 0240   LDLCALC  Value: 73        Total Cholesterol/HDL:CHD Risk Coronary Heart Disease Risk Table                     Men   Women  1/2 Average Risk   3.4   3.3  Average Risk       5.0   4.4  2 X Average Risk   9.6   7.1  3 X Average Risk  23.4   11.0        Use the calculated Patient Ratio above and the CHD Risk Table to determine the patient's CHD Risk.        ATP III CLASSIFICATION (LDL):  <100     mg/dL   Optimal  725-366  mg/dL   Near or Above                    Optimal  130-159  mg/dL   Borderline  440-347  mg/dL   High  >425     mg/dL   Very High 95/63/8756 0240     Past Medical History  Diagnosis Date  . Coronary artery disease 10/22/09     99% proximal to mid LAD, s/p DES x 1.  . CAD (coronary artery disease) 07/2010    septal perf 80%; RI (small) 80%; LAD stent  ok; med tx  . Hyperlipidemia   . Hypertension   . Diverticulosis of colon     Past Surgical History  Procedure Date  . Ear implant-stapes replacement   . Ear surgeries R 1986, L 2003  . Right knee meniscus removal   . Left knee surgery remotely     Current Outpatient Prescriptions  Medication Sig Dispense Refill  . amLODipine (NORVASC) 10 MG tablet Take 1 tablet (10 mg total) by mouth daily.  30 tablet  3  . aspirin 81 MG tablet Take 81 mg by mouth daily.        . clopidogrel (PLAVIX) 75 MG tablet Take 1 tablet (75 mg total) by mouth daily.  30 tablet  3  .  lisinopril (PRINIVIL,ZESTRIL) 40 MG tablet Take one tablet by mouth daily  30 tablet  1  . Multiple Vitamins-Minerals (CENTRUM CARDIO) TABS Take 1 tablet by mouth as needed.        Marland Kitchen NITROSTAT 0.4 MG SL tablet DISSOLVE ONE TABLET UNDER THE TONGUE AS NEEDED FOR CHEST PAIN AS DIRECTED  25 tablet  0  . rosuvastatin (CRESTOR) 20 MG tablet Take 1 tablet (20 mg total) by mouth daily.  30 tablet  12  . sertraline (ZOLOFT) 50 MG tablet TAKE 1 TABLET BY MOUTH EVERY DAY  90 tablet  0  . zolpidem (AMBIEN) 5 MG tablet Take 1 tablet (5 mg total) by mouth at bedtime as needed.  20 tablet  0    No Known Allergies  History   Social History  . Marital Status: Married    Spouse Name: N/A    Number of Children: N/A  . Years of Education: N/A   Occupational History  . Not on file.   Social History Main Topics  . Smoking status: Former Smoker    Quit date: 08/29/2001  . Smokeless tobacco: Never Used  . Alcohol Use: 0.5 oz/week    1 drink(s) per week     1-2 drinks per day  . Drug Use: No  . Sexually Active: Not on file   Other Topics Concern  . Not on file   Social History Narrative   Regular exercise    Family History  Problem Relation Age of Onset  . Coronary artery disease Father   . Coronary artery disease Brother     stent  . Coronary artery disease Other     1st degree male relative  . Hypertension Other   .  Diabetes Other     1st degree relative     Review of Systems:  As stated in the HPI and otherwise negative.   BP 138/88  Pulse 65  Ht 6\' 1"  (1.854 m)  Wt 213 lb (96.616 kg)  BMI 28.10 kg/m2  Physical Examination: General: Well developed, well nourished, NAD HEENT: OP clear, mucus membranes moist SKIN: warm, dry. No rashes. Neuro: No focal deficits Musculoskeletal: Muscle strength 5/5 all ext Psychiatric: Mood and affect normal Neck: No JVD, no carotid bruits, no thyromegaly, no lymphadenopathy. Lungs:Clear bilaterally, no wheezes, rhonci, crackles Cardiovascular: Regular rate and rhythm. No murmurs, gallops or rubs. Abdomen:Soft. Bowel sounds present. Non-tender.  Extremities: No lower extremity edema. Pulses are 2 + in the bilateral DP/PT.  EKG: NSR, rate 65 bpm. Normal EKG.

## 2012-01-31 ENCOUNTER — Other Ambulatory Visit: Payer: BC Managed Care – PPO

## 2012-02-02 ENCOUNTER — Encounter: Payer: Self-pay | Admitting: *Deleted

## 2012-02-02 ENCOUNTER — Other Ambulatory Visit (INDEPENDENT_AMBULATORY_CARE_PROVIDER_SITE_OTHER): Payer: BC Managed Care – PPO

## 2012-02-02 DIAGNOSIS — I2510 Atherosclerotic heart disease of native coronary artery without angina pectoris: Secondary | ICD-10-CM

## 2012-02-02 LAB — HEPATIC FUNCTION PANEL
ALT: 27 U/L (ref 0–53)
AST: 21 U/L (ref 0–37)
Albumin: 4 g/dL (ref 3.5–5.2)
Alkaline Phosphatase: 50 U/L (ref 39–117)
Bilirubin, Direct: 0 mg/dL (ref 0.0–0.3)
Total Protein: 7.5 g/dL (ref 6.0–8.3)

## 2012-02-02 LAB — LIPID PANEL
HDL: 59.6 mg/dL (ref >39.00)
Total CHOL/HDL Ratio: 3

## 2012-02-07 ENCOUNTER — Other Ambulatory Visit: Payer: Self-pay

## 2012-02-07 MED ORDER — LISINOPRIL 40 MG PO TABS: ORAL_TABLET | ORAL | Status: DC

## 2012-02-14 ENCOUNTER — Other Ambulatory Visit: Payer: Self-pay | Admitting: *Deleted

## 2012-02-14 MED ORDER — SERTRALINE HCL 50 MG PO TABS: 50.0000 mg | ORAL_TABLET | Freq: Every day | ORAL | Status: DC

## 2012-02-16 ENCOUNTER — Other Ambulatory Visit: Payer: Self-pay

## 2012-02-16 DIAGNOSIS — I2510 Atherosclerotic heart disease of native coronary artery without angina pectoris: Secondary | ICD-10-CM

## 2012-02-16 MED ORDER — AMLODIPINE BESYLATE 10 MG PO TABS: 10.0000 mg | ORAL_TABLET | Freq: Every day | ORAL | Status: DC

## 2012-03-16 ENCOUNTER — Encounter: Payer: Self-pay | Admitting: Internal Medicine

## 2012-03-16 ENCOUNTER — Ambulatory Visit (INDEPENDENT_AMBULATORY_CARE_PROVIDER_SITE_OTHER): Payer: BC Managed Care – PPO | Admitting: Internal Medicine

## 2012-03-16 VITALS — BP 152/98 | Temp 98.1°F | Wt 218.0 lb

## 2012-03-16 DIAGNOSIS — I10 Essential (primary) hypertension: Secondary | ICD-10-CM

## 2012-03-16 DIAGNOSIS — I2510 Atherosclerotic heart disease of native coronary artery without angina pectoris: Secondary | ICD-10-CM

## 2012-03-16 MED ORDER — HYDROCHLOROTHIAZIDE 25 MG PO TABS: 25.0000 mg | ORAL_TABLET | Freq: Every day | ORAL | Status: DC

## 2012-03-16 NOTE — Patient Instructions (Addendum)
Limit your sodium (Salt) intake    It is important that you exercise regularly, at least 20 minutes 3 to 4 times per week.  If you develop chest pain or shortness of breath seek  medical attention.  Please check your blood pressure on a regular basis.  If it is consistently greater than 150/90, please make an office appointment.  

## 2012-03-16 NOTE — Progress Notes (Signed)
  Subjective:    Patient ID: Jeff Hawkins, male    DOB: 1956/05/26, 56 y.o.   MRN: [id number]  HPI  56 year old patient who has a history of coronary artery disease as well as treated hypertension. Antihypertensive regimen includes amlodipine as well as lisinopril. For the past 6 months the patient has had consistently elevated blood pressure readings often as high as 150/100. He has been on beta blocker therapy in the past which resulted in no severe bradycardia with rates in the low 40s.    Review of Systems  Constitutional: Negative for fever, chills, appetite change and fatigue.  HENT: Negative for hearing loss, ear pain, congestion, sore throat, trouble swallowing, neck stiffness, dental problem, voice change and tinnitus.   Eyes: Negative for pain, discharge and visual disturbance.  Respiratory: Negative for cough, chest tightness, wheezing and stridor.   Cardiovascular: Negative for chest pain, palpitations and leg swelling.  Gastrointestinal: Negative for nausea, vomiting, abdominal pain, diarrhea, constipation, blood in stool and abdominal distention.  Genitourinary: Negative for urgency, hematuria, flank pain, discharge, difficulty urinating and genital sores.  Musculoskeletal: Negative for myalgias, back pain, joint swelling, arthralgias and gait problem.  Skin: Negative for rash.  Neurological: Negative for dizziness, syncope, speech difficulty, weakness, numbness and headaches.  Hematological: Negative for adenopathy. Does not bruise/bleed easily.  Psychiatric/Behavioral: Negative for behavioral problems and dysphoric mood. The patient is not nervous/anxious.        Objective:   Physical Exam  Constitutional: He appears well-developed and well-nourished. No distress.       Blood pressure 140/92 on the right and 150/98 on the left          Assessment & Plan:   Hypertension. Suboptimal control. We'll add hydrochlorothiazide 25 mg daily. We'll recheck with PCP in 6  weeks

## 2012-04-18 ENCOUNTER — Other Ambulatory Visit: Payer: Self-pay | Admitting: Cardiology

## 2012-04-18 MED ORDER — ROSUVASTATIN CALCIUM 20 MG PO TABS: 20.0000 mg | ORAL_TABLET | Freq: Every day | ORAL | Status: DC

## 2012-06-12 ENCOUNTER — Encounter: Payer: Self-pay | Admitting: Family Medicine

## 2012-06-12 ENCOUNTER — Ambulatory Visit (INDEPENDENT_AMBULATORY_CARE_PROVIDER_SITE_OTHER): Payer: BC Managed Care – PPO | Admitting: Family Medicine

## 2012-06-12 VITALS — BP 110/80 | Temp 98.0°F | Wt 218.0 lb

## 2012-06-12 DIAGNOSIS — I2510 Atherosclerotic heart disease of native coronary artery without angina pectoris: Secondary | ICD-10-CM

## 2012-06-12 DIAGNOSIS — R21 Rash and other nonspecific skin eruption: Secondary | ICD-10-CM

## 2012-06-12 DIAGNOSIS — I10 Essential (primary) hypertension: Secondary | ICD-10-CM

## 2012-06-12 DIAGNOSIS — N529 Male erectile dysfunction, unspecified: Secondary | ICD-10-CM

## 2012-06-12 LAB — TESTOSTERONE: Testosterone: 161.1 ng/dL — ABNORMAL LOW (ref 350.00–890.00)

## 2012-06-12 MED ORDER — SILDENAFIL CITRATE 50 MG PO TABS: 50.0000 mg | ORAL_TABLET | ORAL | Status: DC | PRN

## 2012-06-12 MED ORDER — TRIAMCINOLONE ACETONIDE 0.1 % EX CREA: TOPICAL_CREAM | Freq: Two times a day (BID) | CUTANEOUS | Status: DC

## 2012-06-12 NOTE — Progress Notes (Signed)
  Subjective:    Patient ID: Jeff Hawkins, male    DOB: [birth year], 56 y.o.   MRN: [id number]  HPI Jeff Hawkins is a 56 year old married male nonsmoker who has underlying coronary disease who comes in today for evaluation of 2 problems  For the past couple months he's had some red spots on both arms. They're slightly pruritic. His father had a history of melanomas.  He has slowly tapered off his Zoloft and he does not take the Ambien anymore. He says he feels better being off the medication. He did this because he was starting to have some issues with erectile dysfunction. 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 cardiovascular dermatologic and urinary tract review of systems otherwise negative    Objective:   Physical Exam Well-developed and nourished male no acute distress examination of skin shows multiple small papules both forearms consistent with a contact dermatitis,,,,,,,,, may be a reaction from the sunscreens he's been using       Assessment & Plan:  Contact dermatitis Lidex small amounts twice a day  Erectile dysfunction check testosterone level begin low-dose Viagra

## 2012-06-12 NOTE — Patient Instructions (Addendum)
Blood work today  Viagra 50 mg,,,,,,,,,,,,,, one half tablet 2 hours prior to sex with water  A call you I gets her lab work back  Use small amounts of the Lidex gel twice daily until the rash is gone

## 2012-06-19 ENCOUNTER — Encounter: Payer: Self-pay | Admitting: *Deleted

## 2012-09-03 ENCOUNTER — Other Ambulatory Visit: Payer: Self-pay | Admitting: *Deleted

## 2012-09-03 MED ORDER — LISINOPRIL 40 MG PO TABS: ORAL_TABLET | ORAL | Status: DC

## 2012-09-18 ENCOUNTER — Telehealth: Payer: Self-pay | Admitting: Family Medicine

## 2012-09-18 NOTE — Telephone Encounter (Signed)
Jeff Hawkins

## 2012-09-18 NOTE — Telephone Encounter (Signed)
Pt statest dr. Tawanna Cooler referred him to a doctor for low testosterone. [facility] but pt stated he was referred to a specific doctor. Please contact pt

## 2012-09-18 NOTE — Telephone Encounter (Signed)
Patient is aware 

## 2013-02-09 ENCOUNTER — Other Ambulatory Visit: Payer: Self-pay | Admitting: Internal Medicine

## 2013-03-11 ENCOUNTER — Ambulatory Visit (INDEPENDENT_AMBULATORY_CARE_PROVIDER_SITE_OTHER): Payer: BC Managed Care – PPO | Admitting: Cardiovascular Disease

## 2013-03-11 ENCOUNTER — Encounter: Payer: Self-pay | Admitting: Cardiovascular Disease

## 2013-03-11 VITALS — BP 138/82 | HR 69 | Ht 73.0 in | Wt 218.0 lb

## 2013-03-11 DIAGNOSIS — I2510 Atherosclerotic heart disease of native coronary artery without angina pectoris: Secondary | ICD-10-CM

## 2013-03-11 LAB — LIPID PANEL
Cholesterol: 193 mg/dL (ref 0–200)
HDL: 67.8 mg/dL (ref >39.00)
Total CHOL/HDL Ratio: 3
Triglycerides: 99 mg/dL (ref 0.0–149.0)

## 2013-03-11 LAB — HEPATIC FUNCTION PANEL
ALT: 41 U/L (ref 0–53)
AST: 30 U/L (ref 0–37)
Albumin: 4.4 g/dL (ref 3.5–5.2)
Alkaline Phosphatase: 46 U/L (ref 39–117)
Total Protein: 8.2 g/dL (ref 6.0–8.3)

## 2013-03-11 NOTE — Progress Notes (Signed)
History of Present Illness: 57 yo WM with a history of CAD, status post drug-eluting stent to the mid LAD in February 2011, HTN, hyperlipidemia here today for routine cardiac follow up. He was readmitted fall of 2011 with CP. He had elevated CK-MBs but his troponins were negative. Cardiac catheterization demonstrated a patent stent in the LAD. He had a septal perforator coming off of the stent with a stable 80% stenosis and an 80% stenosis in a small ramus intermediate. Medical therapy was pursued.   He is here today for follow up. He is doing well. He denies chest discomfort, shortness of breath, syncope, near syncope, orthopnea or PND.   Primary Care Physician: Dr. Tawanna Cooler  Last Lipid Profile:Lipid Panel     Component Value Date/Time   CHOL 163 02/02/2012 0828   TRIG 123.0 02/02/2012 0828   HDL 59.60 02/02/2012 0828   CHOLHDL 3 02/02/2012 0828   VLDL 24.6 02/02/2012 0828   LDLCALC 79 02/02/2012 0828     Past Medical History  Diagnosis Date  . Coronary artery disease 10/22/09     99% proximal to mid LAD, s/p DES x 1.  . CAD (coronary artery disease) 07/2010    septal perf 80%; RI (small) 80%; LAD stent ok; med tx  . Hyperlipidemia   . Hypertension   . Diverticulosis of colon     Past Surgical History  Procedure Laterality Date  . Ear implant-stapes replacement    . Ear surgeries  R 1986, L 2003  . Right knee meniscus removal    . Left knee surgery remotely      Current Outpatient Prescriptions  Medication Sig Dispense Refill  . amLODipine (NORVASC) 10 MG tablet Take 1 tablet (10 mg total) by mouth daily.  90 tablet  3  . aspirin 81 MG tablet Take 81 mg by mouth daily.        . hydrochlorothiazide (HYDRODIURIL) 25 MG tablet TAKE 1 TABLET BY MOUTH EVERY DAY  90 tablet  0  . lisinopril (PRINIVIL,ZESTRIL) 40 MG tablet Take one tablet by mouth daily  30 tablet  6  . Multiple Vitamins-Minerals (CENTRUM CARDIO) TABS Take 1 tablet by mouth as needed.        Marland Kitchen NITROSTAT 0.4 MG SL tablet  DISSOLVE ONE TABLET UNDER THE TONGUE AS NEEDED FOR CHEST PAIN AS DIRECTED  25 tablet  0  . rosuvastatin (CRESTOR) 20 MG tablet Take 1 tablet (20 mg total) by mouth daily.  30 tablet  12  . sertraline (ZOLOFT) 50 MG tablet Take 1 tablet (50 mg total) by mouth daily.  90 tablet  3  . sildenafil (VIAGRA) 50 MG tablet Take 1 tablet (50 mg total) by mouth as needed for erectile dysfunction.  6 tablet  11   No current facility-administered medications for this visit.    No Known Allergies  History   Social History  . Marital Status: Married    Spouse Name: N/A    Number of Children: N/A  . Years of Education: N/A   Occupational History  . Not on file.   Social History Main Topics  . Smoking status: Former Smoker    Quit date: 08/29/2001  . Smokeless tobacco: Never Used  . Alcohol Use: 0.5 oz/week    1 drink(s) per week     Comment: 1-2 drinks per day  . Drug Use: No  . Sexually Active: Not on file   Other Topics Concern  . Not on file   Social History  Narrative   Regular exercise    Family History  Problem Relation Age of Onset  . Coronary artery disease Father   . Coronary artery disease Brother     stent  . Coronary artery disease Other     1st degree male relative  . Hypertension Other   . Diabetes Other     1st degree relative     Review of Systems:  As stated in the HPI and otherwise negative.   BP 138/82  Pulse 69  Ht 6\' 1"  (1.854 m)  Wt 218 lb (98.884 kg)  BMI 28.77 kg/m2  Physical Examination: General: Well developed, well nourished, NAD HEENT: OP clear, mucus membranes moist SKIN: warm, dry. No rashes. Neuro: No focal deficits Musculoskeletal: Muscle strength 5/5 all ext Psychiatric: Mood and affect normal Neck: No JVD, no carotid bruits, no thyromegaly, no lymphadenopathy. Lungs:Clear bilaterally, no wheezes, rhonci, crackles Cardiovascular: Regular rate and rhythm. No murmurs, gallops or rubs. Abdomen:Soft. Bowel sounds present. Non-tender.    Extremities: No lower extremity edema. Pulses are 2 + in the bilateral DP/PT.  EKG: Normal sinus, rate 69 bpm.   Assessment and Plan:   1. CAD: Stable. Continue ASA, statin. Will check lipids and LFTs today.

## 2013-03-11 NOTE — Patient Instructions (Addendum)
Your physician wants you to follow-up in:  12 months.  You will receive a reminder letter in the mail two months in advance. If you don't receive a letter, please call our office to schedule the follow-up appointment.   

## 2013-03-12 ENCOUNTER — Telehealth: Payer: Self-pay | Admitting: Cardiovascular Disease

## 2013-03-12 NOTE — Telephone Encounter (Signed)
Follow up  Pt is returning a call regarding his test results

## 2013-03-12 NOTE — Telephone Encounter (Signed)
msg left that labs look good/ no med changes, asked to call with further questions.

## 2013-03-20 ENCOUNTER — Other Ambulatory Visit: Payer: Self-pay | Admitting: *Deleted

## 2013-03-20 MED ORDER — SERTRALINE HCL 50 MG PO TABS: 50.0000 mg | ORAL_TABLET | Freq: Every day | ORAL | Status: DC

## 2013-03-22 ENCOUNTER — Ambulatory Visit (INDEPENDENT_AMBULATORY_CARE_PROVIDER_SITE_OTHER): Payer: BC Managed Care – PPO | Admitting: Internal Medicine

## 2013-03-22 ENCOUNTER — Encounter: Payer: Self-pay | Admitting: Internal Medicine

## 2013-03-22 VITALS — BP 146/90 | HR 64 | Temp 98.4°F | Resp 20 | Wt 220.0 lb

## 2013-03-22 DIAGNOSIS — I2510 Atherosclerotic heart disease of native coronary artery without angina pectoris: Secondary | ICD-10-CM

## 2013-03-22 DIAGNOSIS — E785 Hyperlipidemia, unspecified: Secondary | ICD-10-CM

## 2013-03-22 DIAGNOSIS — I10 Essential (primary) hypertension: Secondary | ICD-10-CM

## 2013-03-22 DIAGNOSIS — H9190 Unspecified hearing loss, unspecified ear: Secondary | ICD-10-CM

## 2013-03-22 DIAGNOSIS — H9191 Unspecified hearing loss, right ear: Secondary | ICD-10-CM

## 2013-03-22 MED ORDER — ZOLPIDEM TARTRATE 10 MG PO TABS: 10.0000 mg | ORAL_TABLET | Freq: Every evening | ORAL | Status: DC | PRN

## 2013-03-22 NOTE — Patient Instructions (Addendum)
Limit your sodium (Salt) intake  ENT evaluation as discussedInsomnia Insomnia is frequent trouble falling and/or staying asleep. Insomnia can be a long term problem or a short term problem. Both are common. Insomnia can be a short term problem when the wakefulness is related to a certain stress or worry. Long term insomnia is often related to ongoing stress during waking hours and/or poor sleeping habits. Overtime, sleep deprivation itself can make the problem worse. Every little thing feels more severe because you are overtired and your ability to cope is decreased. CAUSES   Stress, anxiety, and depression.  Poor sleeping habits.  Distractions such as TV in the bedroom.  Naps close to bedtime.  Engaging in emotionally charged conversations before bed.  Technical reading before sleep.  Alcohol and other sedatives. They may make the problem worse. They can hurt normal sleep patterns and normal dream activity.  Stimulants such as caffeine for several hours prior to bedtime.  Pain syndromes and shortness of breath can cause insomnia.  Exercise late at night.  Changing time zones may cause sleeping problems (jet lag). It is sometimes helpful to have someone observe your sleeping patterns. They should look for periods of not breathing during the night (sleep apnea). They should also look to see how long those periods last. If you live alone or observers are uncertain, you can also be observed at a sleep clinic where your sleep patterns will be professionally monitored. Sleep apnea requires a checkup and treatment. Give your caregivers your medical history. Give your caregivers observations your family has made about your sleep.  SYMPTOMS   Not feeling rested in the morning.  Anxiety and restlessness at bedtime.  Difficulty falling and staying asleep. TREATMENT   Your caregiver may prescribe treatment for an underlying medical disorders. Your caregiver can give advice or help if you  are using alcohol or other drugs for self-medication. Treatment of underlying problems will usually eliminate insomnia problems.  Medications can be prescribed for short time use. They are generally not recommended for lengthy use.  Over-the-counter sleep medicines are not recommended for lengthy use. They can be habit forming.  You can promote easier sleeping by making lifestyle changes such as:  Using relaxation techniques that help with breathing and reduce muscle tension.  Exercising earlier in the day.  Changing your diet and the time of your last meal. No night time snacks.  Establish a regular time to go to bed.  Counseling can help with stressful problems and worry.  Soothing music and white noise may be helpful if there are background noises you cannot remove.  Stop tedious detailed work at least one hour before bedtime. HOME CARE INSTRUCTIONS   Keep a diary. Inform your caregiver about your progress. This includes any medication side effects. See your caregiver regularly. Take note of:  Times when you are asleep.  Times when you are awake during the night.  The quality of your sleep.  How you feel the next day. This information will help your caregiver care for you.  Get out of bed if you are still awake after 15 minutes. Read or do some quiet activity. Keep the lights down. Wait until you feel sleepy and go back to bed.  Keep regular sleeping and waking hours. Avoid naps.  Exercise regularly.  Avoid distractions at bedtime. Distractions include watching television or engaging in any intense or detailed activity like attempting to balance the household checkbook.  Develop a bedtime ritual. Keep a familiar routine of bathing,  brushing your teeth, climbing into bed at the same time each night, listening to soothing music. Routines increase the success of falling to sleep faster.  Use relaxation techniques. This can be using breathing and muscle tension release  routines. It can also include visualizing peaceful scenes. You can also help control troubling or intruding thoughts by keeping your mind occupied with boring or repetitive thoughts like the old concept of counting sheep. You can make it more creative like imagining planting one beautiful flower after another in your backyard garden.  During your day, work to eliminate stress. When this is not possible use some of the previous suggestions to help reduce the anxiety that accompanies stressful situations. MAKE SURE YOU:   Understand these instructions.  Will watch your condition.  Will get help right away if you are not doing well or get worse. Document Released: 08/12/2000 Document Revised: 11/07/2011 Document Reviewed: 09/12/2007 [facility] Patient Information 2014 [place], [place].

## 2013-03-22 NOTE — Progress Notes (Signed)
Subjective:    Patient ID: Jeff Hawkins, male    DOB: [birth year], 57 y.o.   MRN: [id number]  HPI   57 year old patient who is seen today for followup. He is followed by cardiology for coronary artery disease and has had a recent evaluation. He remained stable. His chief complaint today is poor sleep habits. He has recently changed his job and there is been some situational stress. He also has a complicated ENT history and is requesting that referral due to decreased auditory acuity on the right. He states that he has had bilateral stapes reconstruction in the past; the right stapes was operated on in 1984 and  the left in 2003 He is requesting ENT referral  Past Medical History  Diagnosis Date  . Coronary artery disease 10/22/09     99% proximal to mid LAD, s/p DES x 1.  . CAD (coronary artery disease) 07/2010    septal perf 80%; RI (small) 80%; LAD stent ok; med tx  . Hyperlipidemia   . Hypertension   . Diverticulosis of colon     History   Social History  . Marital Status: Married    Spouse Name: N/A    Number of Children: N/A  . Years of Education: N/A   Occupational History  . Not on file.   Social History Main Topics  . Smoking status: Former Smoker    Quit date: 08/29/2001  . Smokeless tobacco: Never Used  . Alcohol Use: 0.5 oz/week    1 drink(s) per week     Comment: 1-2 drinks per day  . Drug Use: No  . Sexually Active: Not on file   Other Topics Concern  . Not on file   Social History Narrative   Regular exercise    Past Surgical History  Procedure Laterality Date  . Ear implant-stapes replacement    . Ear surgeries  R 1986, L 2003  . Right knee meniscus removal    . Left knee surgery remotely      Family History  Problem Relation Age of Onset  . Coronary artery disease Father   . Coronary artery disease Brother     stent  . Coronary artery disease Other     1st degree male relative  . Hypertension Other   . Diabetes Other     1st degree  relative     No Known Allergies  Current Outpatient Prescriptions on File Prior to Visit  Medication Sig Dispense Refill  . amLODipine (NORVASC) 10 MG tablet Take 1 tablet (10 mg total) by mouth daily.  90 tablet  3  . aspirin 81 MG tablet Take 81 mg by mouth daily.        . hydrochlorothiazide (HYDRODIURIL) 25 MG tablet TAKE 1 TABLET BY MOUTH EVERY DAY  90 tablet  0  . lisinopril (PRINIVIL,ZESTRIL) 40 MG tablet Take one tablet by mouth daily  30 tablet  6  . Multiple Vitamins-Minerals (CENTRUM CARDIO) TABS Take 1 tablet by mouth as needed.        Marland Kitchen NITROSTAT 0.4 MG SL tablet DISSOLVE ONE TABLET UNDER THE TONGUE AS NEEDED FOR CHEST PAIN AS DIRECTED  25 tablet  0  . rosuvastatin (CRESTOR) 20 MG tablet Take 1 tablet (20 mg total) by mouth daily.  30 tablet  12  . sertraline (ZOLOFT) 50 MG tablet Take 1 tablet (50 mg total) by mouth daily.  90 tablet  1  . sildenafil (VIAGRA) 50 MG tablet Take 1 tablet (50 mg  total) by mouth as needed for erectile dysfunction.  6 tablet  11   No current facility-administered medications on file prior to visit.    BP 146/90  Pulse 64  Temp(Src) 98.4 F (36.9 C) (Oral)  Resp 20  Wt 220 lb (99.791 kg)  BMI 29.03 kg/m2  SpO2 98%    Review of Systems  Constitutional: Negative for fever, chills, appetite change and fatigue.  HENT: Positive for hearing loss. Negative for ear pain, congestion, sore throat, trouble swallowing, neck stiffness, dental problem, voice change and tinnitus.   Eyes: Negative for pain, discharge and visual disturbance.  Respiratory: Negative for cough, chest tightness, wheezing and stridor.   Cardiovascular: Negative for chest pain, palpitations and leg swelling.  Gastrointestinal: Negative for nausea, vomiting, abdominal pain, diarrhea, constipation, blood in stool and abdominal distention.  Genitourinary: Negative for urgency, hematuria, flank pain, discharge, difficulty urinating and genital sores.  Musculoskeletal: Negative  for myalgias, back pain, joint swelling, arthralgias and gait problem.  Skin: Negative for rash.  Neurological: Negative for dizziness, syncope, speech difficulty, weakness, numbness and headaches.  Hematological: Negative for adenopathy. Does not bruise/bleed easily.  Psychiatric/Behavioral: Negative for behavioral problems and dysphoric mood. The patient is not nervous/anxious.        Objective:   Physical Exam  Constitutional: He is oriented to person, place, and time. He appears well-developed.  Repeat blood pressure 140/80  HENT:  Head: Normocephalic.  Right Ear: External ear normal.  Left Ear: External ear normal.  Scarring in the  right tympanic membrane  Eyes: Conjunctivae and EOM are normal.  Neck: Normal range of motion.  Cardiovascular: Normal rate and normal heart sounds.   Pulmonary/Chest: Breath sounds normal.  Abdominal: Bowel sounds are normal.  Musculoskeletal: Normal range of motion. He exhibits no edema and no tenderness.  Neurological: He is alert and oriented to person, place, and time.  Psychiatric: He has a normal mood and affect. His behavior is normal.          Assessment & Plan:   Decreased right auditory acuity. Will refer to ENT Situational insomnia. Will treat with short-term Ambien

## 2013-04-14 ENCOUNTER — Other Ambulatory Visit: Payer: Self-pay | Admitting: Cardiovascular Disease

## 2013-04-19 ENCOUNTER — Other Ambulatory Visit: Payer: Self-pay | Admitting: Cardiovascular Disease

## 2013-04-23 ENCOUNTER — Other Ambulatory Visit: Payer: Self-pay | Admitting: Internal Medicine

## 2013-05-18 ENCOUNTER — Other Ambulatory Visit: Payer: Self-pay | Admitting: Internal Medicine

## 2013-05-18 ENCOUNTER — Other Ambulatory Visit: Payer: Self-pay | Admitting: Cardiovascular Disease

## 2013-07-03 ENCOUNTER — Other Ambulatory Visit: Payer: Self-pay | Admitting: Internal Medicine

## 2013-07-03 ENCOUNTER — Other Ambulatory Visit: Payer: Self-pay | Admitting: Family Medicine

## 2013-07-03 ENCOUNTER — Other Ambulatory Visit: Payer: Self-pay | Admitting: Cardiovascular Disease

## 2013-07-04 ENCOUNTER — Other Ambulatory Visit: Payer: Self-pay

## 2013-07-08 ENCOUNTER — Other Ambulatory Visit: Payer: Self-pay | Admitting: *Deleted

## 2013-07-08 ENCOUNTER — Other Ambulatory Visit: Payer: Self-pay | Admitting: Family Medicine

## 2013-07-08 MED ORDER — ZOLPIDEM TARTRATE 5 MG PO TABS: 5.0000 mg | ORAL_TABLET | Freq: Every evening | ORAL | Status: DC | PRN

## 2013-07-08 NOTE — Telephone Encounter (Signed)
Received fax from pharmacy for refill on Ambien, please advise if okay to refill?

## 2013-09-18 ENCOUNTER — Telehealth: Payer: Self-pay | Admitting: Cardiovascular Disease

## 2013-09-18 NOTE — Telephone Encounter (Signed)
New message          Pt husband had a fall and wants to see if he can be seen when he comes back from [place].

## 2013-09-18 NOTE — Telephone Encounter (Signed)
Spoke with pt's wife. She states pt is currently working in [place]. [place]. He has hip pain from fall and also thinks he pulled muscle in his chest. Chest pain has resolved. Pt would like to schedule appt to see Dr. Clifton JamesMcAlhany for check up when he is home in February. He will arrive on February 4. Appt made for pt to see Dr. Clifton JamesMcAlhany on October 04, 2013 at 11:30. Pt's wife will also contact pt's primary MD to schedule appointment to have hip pain evaluated.

## 2013-09-18 NOTE — Telephone Encounter (Signed)
Left message to call back  

## 2013-09-23 ENCOUNTER — Other Ambulatory Visit: Payer: Self-pay | Admitting: Family Medicine

## 2013-10-04 ENCOUNTER — Encounter: Payer: Self-pay | Admitting: Cardiovascular Disease

## 2013-10-04 ENCOUNTER — Ambulatory Visit (INDEPENDENT_AMBULATORY_CARE_PROVIDER_SITE_OTHER): Payer: BC Managed Care – PPO | Admitting: Cardiovascular Disease

## 2013-10-04 VITALS — BP 132/86 | HR 63 | Ht 73.0 in | Wt 220.0 lb

## 2013-10-04 DIAGNOSIS — I2510 Atherosclerotic heart disease of native coronary artery without angina pectoris: Secondary | ICD-10-CM

## 2013-10-04 DIAGNOSIS — R079 Chest pain, unspecified: Secondary | ICD-10-CM

## 2013-10-04 DIAGNOSIS — E785 Hyperlipidemia, unspecified: Secondary | ICD-10-CM

## 2013-10-04 DIAGNOSIS — I10 Essential (primary) hypertension: Secondary | ICD-10-CM

## 2013-10-04 MED ORDER — CLOPIDOGREL BISULFATE 75 MG PO TABS: 75.0000 mg | ORAL_TABLET | Freq: Every day | ORAL | Status: DC

## 2013-10-04 MED ORDER — NITROGLYCERIN 0.4 MG SL SUBL: SUBLINGUAL_TABLET | SUBLINGUAL | Status: DC

## 2013-10-04 NOTE — Progress Notes (Signed)
History of Present Illness: 58 yo WM with a history of CAD, status post drug-eluting stent to the mid LAD in February 2011, HTN, hyperlipidemia here today for routine cardiac follow up. He was readmitted fall of 2011 with CP. He had elevated CK-MBs but his troponins were negative. Cardiac catheterization demonstrated a patent stent in the LAD. He had a septal perforator coming off of the stent with a stable 80% stenosis and an 80% stenosis in a small ramus intermediate. Medical therapy was pursued. He moved to [place] in September 2014.   He is here today for follow up. He flew home this week. He describes dyspnea when climbing stairs. Some chest discomfort. The pain is not similar to his prior angina. No syncope, near syncope, orthopnea or PND. Also describes fatigue. He is under much stress at his new job in [place].   Primary Care Physician: Dr. Tawanna Cooler  Last Lipid Profile:Lipid Panel     Component Value Date/Time   CHOL 193 03/11/2013 1130   TRIG 99.0 03/11/2013 1130   HDL 67.80 03/11/2013 1130   CHOLHDL 3 03/11/2013 1130   VLDL 19.8 03/11/2013 1130   LDLCALC 105* 03/11/2013 1130     Past Medical History  Diagnosis Date  . Coronary artery disease 10/22/09     99% proximal to mid LAD, s/p DES x 1.  . CAD (coronary artery disease) 07/2010    septal perf 80%; RI (small) 80%; LAD stent ok; med tx  . Hyperlipidemia   . Hypertension   . Diverticulosis of colon     Past Surgical History  Procedure Laterality Date  . Ear implant-stapes replacement    . Ear surgeries  R 1986, L 2003  . Right knee meniscus removal    . Left knee surgery remotely      Current Outpatient Prescriptions  Medication Sig Dispense Refill  . amLODipine (NORVASC) 10 MG tablet TAKE 1 TABLET BY MOUTH EVERY DAY  90 tablet  2  . aspirin 81 MG tablet Take 81 mg by mouth daily.        . CRESTOR 20 MG tablet TAKE 1 TABLET BY MOUTH DAILY  30 tablet  6  . hydrochlorothiazide (HYDRODIURIL) 25 MG tablet TAKE 1 TABLET BY  MOUTH DAILY  90 tablet  3  . lisinopril (PRINIVIL,ZESTRIL) 40 MG tablet TAKE 1 TABLET BY MOUTH EVERY DAY  30 tablet  6  . Multiple Vitamins-Minerals (CENTRUM CARDIO) TABS Take 1 tablet by mouth as needed.        . sertraline (ZOLOFT) 50 MG tablet TAKE 1 TABLET BY MOUTH DAILY  90 tablet  0  . VIAGRA 50 MG tablet TAKE 1 TABLET BY MOUTH AS NEEDED FOR ERECTILE DYSFUNCTION  6 tablet  0  . zolpidem (AMBIEN) 5 MG tablet Take 1 tablet (5 mg total) by mouth at bedtime as needed for sleep.  30 tablet  2  . NITROSTAT 0.4 MG SL tablet DISSOLVE ONE TABLET UNDER THE TONGUE AS NEEDED FOR CHEST PAIN AS DIRECTED  25 tablet  0   No current facility-administered medications for this visit.    No Known Allergies  History   Social History  . Marital Status: Married    Spouse Name: N/A    Number of Children: N/A  . Years of Education: N/A   Occupational History  . Not on file.   Social History Main Topics  . Smoking status: Former Smoker    Quit date: 08/29/2001  . Smokeless tobacco: Never Used  .  Alcohol Use: 0.5 oz/week    1 drink(s) per week     Comment: 1-2 drinks per day  . Drug Use: No  . Sexual Activity: Not on file   Other Topics Concern  . Not on file   Social History Narrative   Regular exercise    Family History  Problem Relation Age of Onset  . Coronary artery disease Father   . Coronary artery disease Brother     stent  . Coronary artery disease Other     1st degree male relative  . Hypertension Other   . Diabetes Other     1st degree relative     Review of Systems:  As stated in the HPI and otherwise negative.   BP 132/86  Pulse 63  Ht 6\' 1"  (1.854 m)  Wt 220 lb (99.791 kg)  BMI 29.03 kg/m2  Physical Examination: General: Well developed, well nourished, NAD HEENT: OP clear, mucus membranes moist SKIN: warm, dry. No rashes. Neuro: No focal deficits Musculoskeletal: Muscle strength 5/5 all ext Psychiatric: Mood and affect normal Neck: No JVD, no carotid  bruits, no thyromegaly, no lymphadenopathy. Lungs:Clear bilaterally, no wheezes, rhonci, crackles Cardiovascular: Regular rate and rhythm. No murmurs, gallops or rubs. Abdomen:Soft. Bowel sounds present. Non-tender.  Extremities: No lower extremity edema. Pulses are 2 + in the bilateral DP/PT.  EKG: NSR, rate 63 bpm.   Assessment and Plan:   1. CAD: He has a mid LAD stent. Recent chest discomfort and dyspnea with exertion, lots of stress at work. Will arrange stress myoview next week to exclude ischemia. Continue ASA, statin. Will add Plavix 75 mg po Qdaily based on recent findings from DAPT trial. No beta blocker due to bradycardia.   2. Hyperlipidemia: Continue statin. Repeat lipids and LFTs this summer  3. HTN: BP controlled. No changes.   4. Chest pain: see above. Plan stress test.

## 2013-10-04 NOTE — Patient Instructions (Signed)
Your physician has requested that you have an exercise stress myoview. For further information please visit [email or web address]. Please follow instruction sheet, as given. Scheduled for October 09, 2013 at 8:30   Your physician has recommended you make the following change in your medication: Start Clopidogrel 75 mg by mouth daily

## 2013-10-09 ENCOUNTER — Ambulatory Visit (HOSPITAL_COMMUNITY): Payer: BC Managed Care – PPO | Attending: Cardiovascular Disease | Admitting: Radiology

## 2013-10-09 VITALS — BP 132/88 | HR 74 | Ht 73.0 in | Wt 207.0 lb

## 2013-10-09 DIAGNOSIS — I2510 Atherosclerotic heart disease of native coronary artery without angina pectoris: Secondary | ICD-10-CM

## 2013-10-09 DIAGNOSIS — I4510 Unspecified right bundle-branch block: Secondary | ICD-10-CM | POA: Insufficient documentation

## 2013-10-09 DIAGNOSIS — I10 Essential (primary) hypertension: Secondary | ICD-10-CM | POA: Insufficient documentation

## 2013-10-09 DIAGNOSIS — E785 Hyperlipidemia, unspecified: Secondary | ICD-10-CM | POA: Insufficient documentation

## 2013-10-09 DIAGNOSIS — R079 Chest pain, unspecified: Secondary | ICD-10-CM

## 2013-10-09 DIAGNOSIS — R5383 Other fatigue: Secondary | ICD-10-CM

## 2013-10-09 DIAGNOSIS — Z87891 Personal history of nicotine dependence: Secondary | ICD-10-CM | POA: Insufficient documentation

## 2013-10-09 DIAGNOSIS — Z8249 Family history of ischemic heart disease and other diseases of the circulatory system: Secondary | ICD-10-CM | POA: Insufficient documentation

## 2013-10-09 DIAGNOSIS — R5381 Other malaise: Secondary | ICD-10-CM | POA: Insufficient documentation

## 2013-10-09 DIAGNOSIS — R0989 Other specified symptoms and signs involving the circulatory and respiratory systems: Secondary | ICD-10-CM | POA: Insufficient documentation

## 2013-10-09 DIAGNOSIS — R0609 Other forms of dyspnea: Secondary | ICD-10-CM | POA: Insufficient documentation

## 2013-10-09 MED ORDER — REGADENOSON 0.4 MG/5ML IV SOLN
0.4000 mg | Freq: Once | INTRAVENOUS | Status: AC
  Administered 2013-10-09: 0.4 mg via INTRAVENOUS

## 2013-10-09 MED ORDER — TECHNETIUM TC 99M SESTAMIBI GENERIC - CARDIOLITE
10.8000 | Freq: Once | INTRAVENOUS | Status: AC | PRN
  Administered 2013-10-09: 11 via INTRAVENOUS

## 2013-10-09 MED ORDER — TECHNETIUM TC 99M SESTAMIBI GENERIC - CARDIOLITE
33.0000 | Freq: Once | INTRAVENOUS | Status: AC | PRN
  Administered 2013-10-09: 33 via INTRAVENOUS

## 2013-10-09 NOTE — Progress Notes (Signed)
[facility] SITE 3 NUCLEAR MED [address] [place], [place] [address] [phone]    Cardiology Nuclear Med Study  EPHRAM KORNEGAY is a 58 y.o. male     MRN : [id number]     DOB: 1955-11-23  Procedure Date: 10/09/2013  Nuclear Med Background Indication for Stress Test:  Evaluation for Ischemia and Stent Patency History:  CAD, Cath, Stent to LAD 2011 Cardiac Risk Factors: Family History - CAD, History of Smoking, Hypertension, Lipids and IRBBB  Symptoms:  Chest Pain, DOE and Fatigue   Nuclear Pre-Procedure Caffeine/Decaff Intake:  None > 12 hrs NPO After: 8:30pm   Lungs:  clear O2 Sat: 97% on room air. IV 0.9% NS with Angio Cath:  22g  IV Site: R Antecubital x 1, tolerated well IV Started by:  Irean Hong, RN  Chest Size (in):  44 Cup Size: n/a  Height: 6\' 1"  (1.854 m)  Weight:  207 lb (93.895 kg)  BMI:  Body mass index is 27.32 kg/(m^2). Tech Comments:  Took Norvasc and Lisinopril this am    Nuclear Med Study 1 or 2 day study: 1 day  Stress Test Type:  Stress  Reading MD: N/A  Order Authorizing Provider:  Verne Carrow, MD  Resting Radionuclide: Technetium 6m Sestamibi  Resting Radionuclide Dose: 11.0 mCi   Stress Radionuclide:  Technetium 53m Sestamibi  Stress Radionuclide Dose: 33.0 mCi           Stress Protocol Rest HR: 74 Stress HR: 146  Rest BP: 132/88 Stress BP: 153/68  Exercise Time (min): 9:45 METS: 11.2           Dose of Adenosine (mg):  n/a Dose of Lexiscan: n/a mg  Dose of Atropine (mg): n/a Dose of Dobutamine: n/a mcg/kg/min (at max HR)  Stress Test Technologist: Nelson Chimes, BS-ES  Nuclear Technologist:  Doyne Keel, CNMT     Rest Procedure:  Myocardial perfusion imaging was performed at rest 45 minutes following the intravenous administration of Technetium 54m Sestamibi. Rest ECG: Normal sinus rhythm. Decreased anterior R wave progression.  Stress Procedure:  The patient exercised on the treadmill utilizing the Bruce  Protocol for 9:45 minutes. The patient stopped due to fatigue and denied any chest pain.  Technetium 41m Sestamibi was injected at peak exercise and myocardial perfusion imaging was performed after a brief delay. Stress ECG: No significant change from baseline ECG  QPS Raw Data Images:  Normal; no motion artifact; normal heart/lung ratio. Stress Images:  There is a small area of mild decreased uptake affecting the mid/ apical inferior segments and the mid/ apical inferolateral segments. There is mild reversibility. Rest Images:  Small area of mild decreased uptake affecting the mid/ apical inferolateral segments Subtraction (SDS):  There is suggestion of very slight ischemia. Transient Ischemic Dilatation (Normal <1.22):  1.00 Lung/Heart Ratio (Normal <0.45):  0.29  Quantitative Gated Spect Images QGS EDV:  102 ml QGS ESV:  45 ml  Impression Exercise Capacity:  There is fairly good exercise tolerance. BP Response:  Normal blood pressure response. Clinical Symptoms:  Patient was limited by fatigue ECG Impression:  No significant ST segment change suggestive of ischemia. Comparison with Prior Nuclear Study: No images to compare  Overall Impression:  This is a low-risk scan. However there is suggestion of a small area of scar with mild peri-infarct ischemia affecting the mid/apical inferior segments and the mid/apical inferolateral segments.  LV Ejection Fraction: 56%.  LV Wall Motion:  Overall wall motion is good. There  are no obvious focal wall motion abnormalities.  Willa RoughJeffrey Katz, MD

## 2013-10-10 ENCOUNTER — Telehealth: Payer: Self-pay | Admitting: Cardiovascular Disease

## 2013-10-10 NOTE — Telephone Encounter (Signed)
Spoke with pt and reviewed stress test results with him.  

## 2013-11-13 ENCOUNTER — Other Ambulatory Visit: Payer: Self-pay | Admitting: Cardiovascular Disease

## 2013-11-13 ENCOUNTER — Other Ambulatory Visit: Payer: Self-pay | Admitting: Family Medicine

## 2014-02-06 ENCOUNTER — Other Ambulatory Visit: Payer: Self-pay | Admitting: Cardiovascular Disease

## 2014-02-06 ENCOUNTER — Other Ambulatory Visit: Payer: Self-pay | Admitting: Family Medicine

## 2014-02-06 ENCOUNTER — Other Ambulatory Visit: Payer: Self-pay | Admitting: Internal Medicine

## 2014-05-24 ENCOUNTER — Other Ambulatory Visit: Payer: Self-pay | Admitting: Family Medicine

## 2014-06-08 ENCOUNTER — Emergency Department (HOSPITAL_COMMUNITY): Payer: Managed Care, Other (non HMO)

## 2014-06-08 ENCOUNTER — Encounter (HOSPITAL_COMMUNITY): Payer: Self-pay | Admitting: Emergency Medicine

## 2014-06-08 ENCOUNTER — Emergency Department (HOSPITAL_COMMUNITY)
Admission: EM | Admit: 2014-06-08 | Discharge: 2014-06-08 | Disposition: A | Discharge status: Home / Self Care | Payer: Managed Care, Other (non HMO) | Attending: Emergency Medicine | Admitting: Emergency Medicine

## 2014-06-08 DIAGNOSIS — S0003XA Contusion of scalp, initial encounter: Secondary | ICD-10-CM

## 2014-06-08 DIAGNOSIS — E785 Hyperlipidemia, unspecified: Secondary | ICD-10-CM | POA: Diagnosis not present

## 2014-06-08 DIAGNOSIS — Y9352 Activity, horseback riding: Secondary | ICD-10-CM | POA: Diagnosis not present

## 2014-06-08 DIAGNOSIS — I2510 Atherosclerotic heart disease of native coronary artery without angina pectoris: Secondary | ICD-10-CM | POA: Insufficient documentation

## 2014-06-08 DIAGNOSIS — I10 Essential (primary) hypertension: Secondary | ICD-10-CM | POA: Insufficient documentation

## 2014-06-08 DIAGNOSIS — Z8719 Personal history of other diseases of the digestive system: Secondary | ICD-10-CM | POA: Diagnosis not present

## 2014-06-08 DIAGNOSIS — S40211A Abrasion of right shoulder, initial encounter: Secondary | ICD-10-CM | POA: Insufficient documentation

## 2014-06-08 DIAGNOSIS — S199XXA Unspecified injury of neck, initial encounter: Secondary | ICD-10-CM | POA: Diagnosis not present

## 2014-06-08 DIAGNOSIS — S0990XA Unspecified injury of head, initial encounter: Secondary | ICD-10-CM | POA: Diagnosis present

## 2014-06-08 DIAGNOSIS — Z23 Encounter for immunization: Secondary | ICD-10-CM | POA: Diagnosis not present

## 2014-06-08 DIAGNOSIS — Y929 Unspecified place or not applicable: Secondary | ICD-10-CM | POA: Diagnosis not present

## 2014-06-08 DIAGNOSIS — S0101XA Laceration without foreign body of scalp, initial encounter: Secondary | ICD-10-CM

## 2014-06-08 DIAGNOSIS — S060X1A Concussion with loss of consciousness of 30 minutes or less, initial encounter: Secondary | ICD-10-CM | POA: Diagnosis not present

## 2014-06-08 LAB — BASIC METABOLIC PANEL
Anion gap: 11 (ref 5–15)
BUN: 12 mg/dL (ref 6–23)
CHLORIDE: 104 meq/L (ref 96–112)
CO2: 24 mEq/L (ref 19–32)
Calcium: 9.3 mg/dL (ref 8.4–10.5)
Creatinine, Ser: 1.07 mg/dL (ref 0.50–1.35)
GFR, EST AFRICAN AMERICAN: 85 mL/min — AB (ref >90)
GFR, EST NON AFRICAN AMERICAN: 74 mL/min — AB (ref >90)
Glucose, Bld: 105 mg/dL — ABNORMAL HIGH (ref 70–99)
POTASSIUM: 3.8 meq/L (ref 3.7–5.3)
SODIUM: 139 meq/L (ref 137–147)

## 2014-06-08 LAB — CBC
HCT: 37.5 % — ABNORMAL LOW (ref 39.0–52.0)
HEMOGLOBIN: 13.3 g/dL (ref 13.0–17.0)
MCH: 32.3 pg (ref 26.0–34.0)
MCHC: 35.5 g/dL (ref 30.0–36.0)
MCV: 91 fL (ref 78.0–100.0)
Platelets: 284 10*3/uL (ref 150–400)
RBC: 4.12 MIL/uL — ABNORMAL LOW (ref 4.22–5.81)
RDW: 13.1 % (ref 11.5–15.5)
WBC: 9.4 10*3/uL (ref 4.0–10.5)

## 2014-06-08 MED ORDER — MORPHINE SULFATE 4 MG/ML IJ SOLN
4.0000 mg | Freq: Once | INTRAMUSCULAR | Status: AC
  Administered 2014-06-08: 4 mg via INTRAVENOUS
  Filled 2014-06-08: qty 1

## 2014-06-08 MED ORDER — IBUPROFEN 800 MG PO TABS: 800.0000 mg | ORAL_TABLET | Freq: Three times a day (TID) | ORAL | Status: DC

## 2014-06-08 MED ORDER — DIAZEPAM 5 MG PO TABS: 5.0000 mg | ORAL_TABLET | Freq: Four times a day (QID) | ORAL | Status: DC | PRN

## 2014-06-08 MED ORDER — TETANUS-DIPHTH-ACELL PERTUSSIS 5-2.5-18.5 LF-MCG/0.5 IM SUSP
0.5000 mL | Freq: Once | INTRAMUSCULAR | Status: AC
  Administered 2014-06-08: 0.5 mL via INTRAMUSCULAR
  Filled 2014-06-08: qty 0.5

## 2014-06-08 MED ORDER — OXYCODONE-ACETAMINOPHEN 5-325 MG PO TABS: 1.0000 | ORAL_TABLET | Freq: Four times a day (QID) | ORAL | Status: DC | PRN

## 2014-06-08 NOTE — ED Notes (Signed)
[place] [facility]

## 2014-06-08 NOTE — ED Notes (Signed)
Pt fell off a horse hitting his head , pt had a positive loc and some seizure like activity from people on seen, none seen by EMS , pt is also c/o right shoulder pain and back pain

## 2014-06-08 NOTE — ED Provider Notes (Signed)
CSN: [id number]     Arrival date & time 06/08/14  1302 History   First MD Initiated Contact with Patient 06/08/14 1302     Chief Complaint  Patient presents with  . Fall     (Consider location/radiation/quality/duration/timing/severity/associated sxs/prior Treatment) HPI Comments: This is a 58 year old male with a past medical history of CAD, hyperlipidemia and hypertension who presents to the emergency department via EMS after falling off of a horse. Patient reports the horse was acting out while going over the creek and he got thrown off. He hit the back of his head, had positive loss of consciousness for a few seconds, had seizure-like activity that was witnessed by the horse trainer along with an episode of incontinence. On EMS arrival, patient was alert and oriented x3 no further seizure-like activity. Currently patient is complaining of pain to the back of his head, mild neck pain, right shoulder, and very mild left leg pain. Pain is a dull ache and throbbing. Denies confusion, numbness, tingling, vision changes or weakness. Denies abdominal pain, back pain, chest pain. He took himself off of Plavix a week and a half ago after he got a hematoma to his leg from a mechanical fall.  Patient is a 58 y.o. male presenting with fall. The history is provided by the patient.  Fall Associated symptoms include headaches and neck pain.    Past Medical History  Diagnosis Date  . Coronary artery disease 10/22/09     99% proximal to mid LAD, s/p DES x 1.  . CAD (coronary artery disease) 07/2010    septal perf 80%; RI (small) 80%; LAD stent ok; med tx  . Hyperlipidemia   . Hypertension   . Diverticulosis of colon    Past Surgical History  Procedure Laterality Date  . Ear implant-stapes replacement    . Ear surgeries  R 1986, L 2003  . Right knee meniscus removal    . Left knee surgery remotely     Family History  Problem Relation Age of Onset  . Coronary artery disease Father   . Coronary  artery disease Brother     stent  . Coronary artery disease Other     1st degree male relative  . Hypertension Other   . Diabetes Other     1st degree relative    History  Substance Use Topics  . Smoking status: Former Smoker    Quit date: 08/29/2001  . Smokeless tobacco: Never Used  . Alcohol Use: 0.5 oz/week    1 drink(s) per week     Comment: 1-2 drinks per day    Review of Systems  Musculoskeletal: Positive for neck pain.       + R shoulder pain. + Mild L leg discomfort.  Neurological: Positive for headaches.  All other systems reviewed and are negative.     Allergies  Review of patient's allergies indicates no known allergies.  Home Medications   Prior to Admission medications   Medication Sig Start Date End Date Taking? Authorizing Provider  aspirin EC 81 MG tablet Take 81 mg by mouth daily.   Yes Historical Provider, MD  hydrochlorothiazide (HYDRODIURIL) 25 MG tablet Take 25 mg by mouth daily.   Yes Historical Provider, MD  lisinopril (PRINIVIL,ZESTRIL) 20 MG tablet Take 20 mg by mouth daily.   Yes Historical Provider, MD  nitroGLYCERIN (NITROSTAT) 0.4 MG SL tablet Place 0.4 mg under the tongue every 5 (five) minutes as needed for chest pain.   Yes Historical Provider, MD  rosuvastatin (CRESTOR) 20 MG tablet Take 20 mg by mouth daily.   Yes Historical Provider, MD  zolpidem (AMBIEN) 5 MG tablet Take 5 mg by mouth at bedtime as needed for sleep.   Yes Historical Provider, MD  diazepam (VALIUM) 5 MG tablet Take 1 tablet (5 mg total) by mouth every 6 (six) hours as needed for anxiety (spasms). 06/08/14   Manoah Deckard M Emiley Digiacomo, PA-C  ibuprofen (ADVIL,MOTRIN) 800 MG tablet Take 1 tablet (800 mg total) by mouth 3 (three) times daily. 06/08/14   Tomothy Eddins M Flonnie Wierman, PA-C  oxyCODONE-acetaminophen (PERCOCET) 5-325 MG per tablet Take 1-2 tablets by mouth every 6 (six) hours as needed for severe pain. 06/08/14   Anja Neuzil M Jeena Arnett, PA-C   BP 140/92  Pulse 63  Temp(Src) 98.4 F (36.9 C)  Resp  13  Ht 6\' 1"  (1.854 m)  Wt 190 lb (86.183 kg)  BMI 25.07 kg/m2  SpO2 100% Physical Exam  Nursing note and vitals reviewed. Constitutional: He is oriented to person, place, and time. He appears well-developed and well-nourished. No distress.  C-collar and back board in place, pt removed from back board.  HENT:  Head: Normocephalic. Head is without Battle's sign.    Right Ear: No hemotympanum.  Left Ear: No hemotympanum.  Nose: Nose normal.  Mouth/Throat: Oropharynx is clear and moist.  Eyes: Conjunctivae and EOM are normal. Pupils are equal, round, and reactive to light.  Neck: Normal range of motion. Neck supple. No spinous process tenderness and no muscular tenderness present.  Cardiovascular: Normal rate, regular rhythm and normal heart sounds.   Pulmonary/Chest: Effort normal and breath sounds normal. No respiratory distress.  Abdominal: Soft. Bowel sounds are normal. He exhibits no distension. There is no tenderness.  Musculoskeletal: He exhibits no edema.       Arms: R shoulder TTP posteriorly with abrasion. Bleeding controlled. FROM with soreness. No deformity. Very mild TTP anterior aspect of left leg. No deformity. FROM left hip without pain.  Neurological: He is alert and oriented to person, place, and time. He has normal strength. No cranial nerve deficit. Coordination normal. GCS eye subscore is 4. GCS verbal subscore is 5. GCS motor subscore is 6.  Strength lower extremities 5/5 and equal bilateral. Sensation intact. Gait not assessed initial exam.  Skin: Skin is warm and dry. No rash noted. He is not diaphoretic.  Psychiatric: He has a normal mood and affect. His behavior is normal.    ED Course  Procedures (including critical care time) LACERATION REPAIR Performed by: Celene SkeenHess, Cathyrn Deas Authorized by: Celene SkeenHess, Samantha Ragen Consent: Verbal consent obtained. Risks and benefits: risks, benefits and alternatives were discussed Consent given by: patient Patient identity confirmed:  provided demographic data Prepped and Draped in normal sterile fashion Wound explored  Laceration Location: scalp  Laceration Length: 2cm  No Foreign Bodies seen or palpated  Anesthesia: none Irrigation method: syringe Amount of cleaning: standard  Skin closure: staples  Number of staples: 3  Patient tolerance: Patient tolerated the procedure well with no immediate complications.  Labs Review Labs Reviewed  CBC - Abnormal; Notable for the following:    RBC 4.12 (*)    HCT 37.5 (*)    All other components within normal limits  BASIC METABOLIC PANEL - Abnormal; Notable for the following:    Glucose, Bld 105 (*)    GFR calc non Af Amer 74 (*)    GFR calc Af Amer 85 (*)    All other components within normal limits    Imaging  Review Dg Shoulder Right  06/08/2014   CLINICAL DATA:  Patient fell off of a horse on to a gravel path. Posterior right shoulder pain. Abrasions posterior to the right shoulder. Initial encounter.  EXAM: RIGHT SHOULDER - 2+ VIEW  COMPARISON:  None.  FINDINGS: An axillary view was not obtainable as the patient was unable to raise the arm. No visible acute fracture. No evidence of glenohumeral dislocation. Acromioclavicular joint intact with degenerative changes. Subacromial space well preserved. No opaque foreign bodies in the overlying soft tissues.  IMPRESSION: No acute osseous abnormality. Degenerative changes involving the [facility] joint.   Electronically Signed   By: Hulan Saas M.D.   On: 06/08/2014 14:25   Dg Hip Complete Left  06/08/2014   CLINICAL DATA:  Larey Seat off horse on to gravel. Left posterior hip pain. Initial encounter.  EXAM: LEFT HIP - COMPLETE 2+ VIEW  COMPARISON:  None.  FINDINGS: There is no evidence of hip fracture or dislocation. There is no evidence of arthropathy or other focal bone abnormality. Hip joints and SI joints are symmetric and unremarkable.  IMPRESSION: Negative.   Electronically Signed   By: Charlett Nose M.D.   On: 06/08/2014  14:23   Ct Head Wo Contrast  06/08/2014   CLINICAL DATA:  Fall off of horse today with loss of consciousness and seizure like activity. Complaint of headache and nausea.  EXAM: CT HEAD WITHOUT CONTRAST  CT CERVICAL SPINE WITHOUT CONTRAST  TECHNIQUE: Multidetector CT imaging of the head and cervical spine was performed following the standard protocol without intravenous contrast. Multiplanar CT image reconstructions of the cervical spine were also generated.  COMPARISON:  None.  FINDINGS: CT HEAD FINDINGS  The brain demonstrates no evidence of hemorrhage, infarction, edema, mass effect, extra-axial fluid collection, hydrocephalus or mass lesion. Scalp hematoma present overlying the right posterior parietal region. No evidence of skull fracture.  CT CERVICAL SPINE FINDINGS  The cervical spine shows normal alignment. There is no evidence of acute fracture or subluxation. No soft tissue swelling or hematoma is identified. Mild spondylosis at the C4-5, C5-6 and C6-7 levels. No bony or soft tissue lesions are seen. The visualized airway is normally patent.  IMPRESSION: 1. Right posterior parietal scalp hematoma without evidence of skull fracture or intracranial injury. 2. No evidence of cervical fracture. Mild cervical spondylosis identified.   Electronically Signed   By: Irish Lack M.D.   On: 06/08/2014 13:59   Ct Cervical Spine Wo Contrast  06/08/2014   CLINICAL DATA:  Fall off of horse today with loss of consciousness and seizure like activity. Complaint of headache and nausea.  EXAM: CT HEAD WITHOUT CONTRAST  CT CERVICAL SPINE WITHOUT CONTRAST  TECHNIQUE: Multidetector CT imaging of the head and cervical spine was performed following the standard protocol without intravenous contrast. Multiplanar CT image reconstructions of the cervical spine were also generated.  COMPARISON:  None.  FINDINGS: CT HEAD FINDINGS  The brain demonstrates no evidence of hemorrhage, infarction, edema, mass effect, extra-axial  fluid collection, hydrocephalus or mass lesion. Scalp hematoma present overlying the right posterior parietal region. No evidence of skull fracture.  CT CERVICAL SPINE FINDINGS  The cervical spine shows normal alignment. There is no evidence of acute fracture or subluxation. No soft tissue swelling or hematoma is identified. Mild spondylosis at the C4-5, C5-6 and C6-7 levels. No bony or soft tissue lesions are seen. The visualized airway is normally patent.  IMPRESSION: 1. Right posterior parietal scalp hematoma without evidence of skull fracture or  intracranial injury. 2. No evidence of cervical fracture. Mild cervical spondylosis identified.   Electronically Signed   By: Irish LackGlenn  Yamagata M.D.   On: 06/08/2014 13:59     EKG Interpretation None      MDM   Final diagnoses:  Fall from horse, initial encounter  Concussion, with loss of consciousness of 30 minutes or less, initial encounter  Scalp laceration, initial encounter  Scalp hematoma, initial encounter  Shoulder abrasion, right, initial encounter   Patient presenting after falling off a horse with loss of consciousness. No apparent distress. No focal neurologic deficits. Head and C-spine CT without any acute finding. Left hip and right shoulder x-ray without any acute finding. C-collar removed and patient able to perform full range of motion without pain. Laceration on the posterior aspect of his head closed with staples. Wound care given. Tetanus updated. He is able to ambulate without difficulty. Concussion precautions discussed. Will d/c home with pain medication. F/u with PCP. Return precautions given. Patient states understanding of treatment care plan and is agreeable.  Case discussed with attending Dr. Patria Maneampos who also evaluated patient and agrees with plan of care.    Kathrynn SpeedRobyn M Takari Duncombe, PA-C 06/08/14 1531

## 2014-06-08 NOTE — ED Provider Notes (Signed)
Medical screening examination/treatment/procedure(s) were conducted as a shared visit with non-physician practitioner(s) and myself.  I personally evaluated the patient during the encounter.   EKG Interpretation None      Ambulatory in the emergency department.  CT head C-spine without significant abnormality.  Laceration repair.  Ongoing right shoulder pain despite normal imaging.  He has a normal right radial pulse.  He understands to followup with his orthopedic surgeon if he continues to have discomfort in his right shoulder as he may require MRI.  Head injury warnings given.  Concussion warnings given.  Patient discharged home with his spouse.  He understands to return to the ER for new or worsening symptoms.  Staple removal in 10 days.  Infection warnings given.  Lyanne CoKevin M Isac Lincks, MD 06/08/14 [phone]

## 2014-06-08 NOTE — Discharge Instructions (Signed)
Take percocet for severe pain only. No driving or operating heavy machinery while taking percocet. This medication may cause drowsiness. Take Valium as needed as directed for muscle spasm. No driving or operating heavy machinery while taking valium. This medication may cause drowsiness. Take ibuprofen as directed. Rest, apply ice. Follow up with your primary care doctor.  Concussion A concussion, or closed-head injury, is a brain injury caused by a direct blow to the head or by a quick and sudden movement (jolt) of the head or neck. Concussions are usually not life-threatening. Even so, the effects of a concussion can be serious. If you have had a concussion before, you are more likely to experience concussion-like symptoms after a direct blow to the head.  CAUSES  Direct blow to the head, such as from running into another player during a soccer game, being hit in a fight, or hitting your head on a hard surface.  A jolt of the head or neck that causes the brain to move back and forth inside the skull, such as in a car crash. SIGNS AND SYMPTOMS The signs of a concussion can be hard to notice. Early on, they may be missed by you, family members, and health care providers. You may look fine but act or feel differently. Symptoms are usually temporary, but they may last for days, weeks, or even longer. Some symptoms may appear right away while others may not show up for hours or days. Every head injury is different. Symptoms include:  Mild to moderate headaches that will not go away.  A feeling of pressure inside your head.  Having more trouble than usual:  Learning or remembering things you have heard.  Answering questions.  Paying attention or concentrating.  Organizing daily tasks.  Making decisions and solving problems.  Slowness in thinking, acting or reacting, speaking, or reading.  Getting lost or being easily confused.  Feeling tired all the time or lacking energy  (fatigued).  Feeling drowsy.  Sleep disturbances.  Sleeping more than usual.  Sleeping less than usual.  Trouble falling asleep.  Trouble sleeping (insomnia).  Loss of balance or feeling lightheaded or dizzy.  Nausea or vomiting.  Numbness or tingling.  Increased sensitivity to:  Sounds.  Lights.  Distractions.  Vision problems or eyes that tire easily.  Diminished sense of taste or smell.  Ringing in the ears.  Mood changes such as feeling sad or anxious.  Becoming easily irritated or angry for little or no reason.  Lack of motivation.  Seeing or hearing things other people do not see or hear (hallucinations). DIAGNOSIS Your health care provider can usually diagnose a concussion based on a description of your injury and symptoms. He or she will ask whether you passed out (lost consciousness) and whether you are having trouble remembering events that happened right before and during your injury. Your evaluation might include:  A brain scan to look for signs of injury to the brain. Even if the test shows no injury, you may still have a concussion.  Blood tests to be sure other problems are not present. TREATMENT  Concussions are usually treated in an emergency department, in urgent care, or at a clinic. You may need to stay in the hospital overnight for further treatment.  Tell your health care provider if you are taking any medicines, including prescription medicines, over-the-counter medicines, and natural remedies. Some medicines, such as blood thinners (anticoagulants) and aspirin, may increase the chance of complications. Also tell your health care provider  whether you have had alcohol or are taking illegal drugs. This information may affect treatment.  Your health care provider will send you home with important instructions to follow.  How fast you will recover from a concussion depends on many factors. These factors include how severe your concussion is,  what part of your brain was injured, your age, and how healthy you were before the concussion.  Most people with mild injuries recover fully. Recovery can take time. In general, recovery is slower in older persons. Also, persons who have had a concussion in the past or have other medical problems may find that it takes longer to recover from their current injury. HOME CARE INSTRUCTIONS General Instructions  Carefully follow the directions your health care provider gave you.  Only take over-the-counter or prescription medicines for pain, discomfort, or fever as directed by your health care provider.  Take only those medicines that your health care provider has approved.  Do not drink alcohol until your health care provider says you are well enough to do so. Alcohol and certain other drugs may slow your recovery and can put you at risk of further injury.  If it is harder than usual to remember things, write them down.  If you are easily distracted, try to do one thing at a time. For example, do not try to watch TV while fixing dinner.  Talk with family members or close friends when making important decisions.  Keep all follow-up appointments. Repeated evaluation of your symptoms is recommended for your recovery.  Watch your symptoms and tell others to do the same. Complications sometimes occur after a concussion. Older adults with a brain injury may have a higher risk of serious complications, such as a blood clot on the brain.  Tell your teachers, school nurse, school counselor, coach, athletic trainer, or work Production designer, theatre/television/filmmanager about your injury, symptoms, and restrictions. Tell them about what you can or cannot do. They should watch for:  Increased problems with attention or concentration.  Increased difficulty remembering or learning new information.  Increased time needed to complete tasks or assignments.  Increased irritability or decreased ability to cope with stress.  Increased  symptoms.  Rest. Rest helps the brain to heal. Make sure you:  Get plenty of sleep at night. Avoid staying up late at night.  Keep the same bedtime hours on weekends and weekdays.  Rest during the day. Take daytime naps or rest breaks when you feel tired.  Limit activities that require a lot of thought or concentration. These include:  Doing homework or job-related work.  Watching TV.  Working on the computer.  Avoid any situation where there is potential for another head injury (football, hockey, soccer, basketball, martial arts, downhill snow sports and horseback riding). Your condition will get worse every time you experience a concussion. You should avoid these activities until you are evaluated by the appropriate follow-up health care providers. Returning To Your Regular Activities You will need to return to your normal activities slowly, not all at once. You must give your body and brain enough time for recovery.  Do not return to sports or other athletic activities until your health care provider tells you it is safe to do so.  Ask your health care provider when you can drive, ride a bicycle, or operate heavy machinery. Your ability to react may be slower after a brain injury. Never do these activities if you are dizzy.  Ask your health care provider about when you can return  to work or school. Preventing Another Concussion It is very important to avoid another brain injury, especially before you have recovered. In rare cases, another injury can lead to permanent brain damage, brain swelling, or death. The risk of this is greatest during the first 7-10 days after a head injury. Avoid injuries by:  Wearing a seat belt when riding in a car.  Drinking alcohol only in moderation.  Wearing a helmet when biking, skiing, skateboarding, skating, or doing similar activities.  Avoiding activities that could lead to a second concussion, such as contact or recreational sports, until  your health care provider says it is okay.  Taking safety measures in your home.  Remove clutter and tripping hazards from floors and stairways.  Use grab bars in bathrooms and handrails by stairs.  Place non-slip mats on floors and in bathtubs.  Improve lighting in dim areas. SEEK MEDICAL CARE IF:  You have increased problems paying attention or concentrating.  You have increased difficulty remembering or learning new information.  You need more time to complete tasks or assignments than before.  You have increased irritability or decreased ability to cope with stress.  You have more symptoms than before. Seek medical care if you have any of the following symptoms for more than 2 weeks after your injury:  Lasting (chronic) headaches.  Dizziness or balance problems.  Nausea.  Vision problems.  Increased sensitivity to noise or light.  Depression or mood swings.  Anxiety or irritability.  Memory problems.  Difficulty concentrating or paying attention.  Sleep problems.  Feeling tired all the time. SEEK IMMEDIATE MEDICAL CARE IF:  You have severe or worsening headaches. These may be a sign of a blood clot in the brain.  You have weakness (even if only in one hand, leg, or part of the face).  You have numbness.  You have decreased coordination.  You vomit repeatedly.  You have increased sleepiness.  One pupil is larger than the other.  You have convulsions.  You have slurred speech.  You have increased confusion. This may be a sign of a blood clot in the brain.  You have increased restlessness, agitation, or irritability.  You are unable to recognize people or places.  You have neck pain.  It is difficult to wake you up.  You have unusual behavior changes.  You lose consciousness. MAKE SURE YOU:  Understand these instructions.  Will watch your condition.  Will get help right away if you are not doing well or get worse. Document  Released: 11/05/2003 Document Revised: 08/20/2013 Document Reviewed: 03/07/2013 [facility] Patient Information 2015 [place], [place]. This information is not intended to replace advice given to you by your health care provider. Make sure you discuss any questions you have with your health care provider.  Head Injury You have received a head injury. It does not appear serious at this time. Headaches and vomiting are common following head injury. It should be easy to awaken from sleeping. Sometimes it is necessary for you to stay in the emergency department for a while for observation. Sometimes admission to the hospital may be needed. After injuries such as yours, most problems occur within the first 24 hours, but side effects may occur up to 7-10 days after the injury. It is important for you to carefully monitor your condition and contact your health care provider or seek immediate medical care if there is a change in your condition. WHAT ARE THE TYPES OF HEAD INJURIES? Head injuries can be as  minor as a bump. Some head injuries can be more severe. More severe head injuries include:  A jarring injury to the brain (concussion).  A bruise of the brain (contusion). This mean there is bleeding in the brain that can cause swelling.  A cracked skull (skull fracture).  Bleeding in the brain that collects, clots, and forms a bump (hematoma). WHAT CAUSES A HEAD INJURY? A serious head injury is most likely to happen to someone who is in a car wreck and is not wearing a seat belt. Other causes of major head injuries include bicycle or motorcycle accidents, sports injuries, and falls. HOW ARE HEAD INJURIES DIAGNOSED? A complete history of the event leading to the injury and your current symptoms will be helpful in diagnosing head injuries. Many times, pictures of the brain, such as CT or MRI are needed to see the extent of the injury. Often, an overnight hospital stay is necessary for observation.  WHEN SHOULD  I SEEK IMMEDIATE MEDICAL CARE?  You should get help right away if:  You have confusion or drowsiness.  You feel sick to your stomach (nauseous) or have continued, forceful vomiting.  You have dizziness or unsteadiness that is getting worse.  You have severe, continued headaches not relieved by medicine. Only take over-the-counter or prescription medicines for pain, fever, or discomfort as directed by your health care provider.  You do not have normal function of the arms or legs or are unable to walk.  You notice changes in the black spots in the center of the colored part of your eye (pupil).  You have a clear or bloody fluid coming from your nose or ears.  You have a loss of vision. During the next 24 hours after the injury, you must stay with someone who can watch you for the warning signs. This person should contact local emergency services (911 in the [place] if you have seizures, you become unconscious, or you are unable to wake up. HOW CAN I PREVENT A HEAD INJURY IN THE FUTURE? The most important factor for preventing major head injuries is avoiding motor vehicle accidents. To minimize the potential for damage to your head, it is crucial to wear seat belts while riding in motor vehicles. Wearing helmets while bike riding and playing collision sports (like football) is also helpful. Also, avoiding dangerous activities around the house will further help reduce your risk of head injury.  WHEN CAN I RETURN TO NORMAL ACTIVITIES AND ATHLETICS? You should be reevaluated by your health care provider before returning to these activities. If you have any of the following symptoms, you should not return to activities or contact sports until 1 week after the symptoms have stopped:  Persistent headache.  Dizziness or vertigo.  Poor attention and concentration.  Confusion.  Memory problems.  Nausea or vomiting.  Fatigue or tire easily.  Irritability.  Intolerant of bright lights or  loud noises.  Anxiety or depression.  Disturbed sleep. MAKE SURE YOU:   Understand these instructions.  Will watch your condition.  Will get help right away if you are not doing well or get worse. Document Released: 08/15/2005 Document Revised: 08/20/2013 Document Reviewed: 04/22/2013 [facility] Patient Information 2015 [place], [place]. This information is not intended to replace advice given to you by your health care provider. Make sure you discuss any questions you have with your health care provider.  Facial or Scalp Contusion A facial or scalp contusion is a deep bruise on the face or head. Injuries to the  face and head generally cause a lot of swelling, especially around the eyes. Contusions are the result of an injury that caused bleeding under the skin. The contusion may turn blue, purple, or yellow. Minor injuries will give you a painless contusion, but more severe contusions may stay painful and swollen for a few weeks.  CAUSES  A facial or scalp contusion is caused by a blunt injury or trauma to the face or head area.  SIGNS AND SYMPTOMS   Swelling of the injured area.   Discoloration of the injured area.   Tenderness, soreness, or pain in the injured area.  DIAGNOSIS  The diagnosis can be made by taking a medical history and doing a physical exam. An X-ray exam, CT scan, or MRI may be needed to determine if there are any associated injuries, such as broken bones (fractures). TREATMENT  Often, the best treatment for a facial or scalp contusion is applying cold compresses to the injured area. Over-the-counter medicines may also be recommended for pain control.  HOME CARE INSTRUCTIONS   Only take over-the-counter or prescription medicines as directed by your health care provider.   Apply ice to the injured area.   Put ice in a plastic bag.   Place a towel between your skin and the bag.   Leave the ice on for 20 minutes, 2-3 times a day.  SEEK MEDICAL CARE  IF:  You have bite problems.   You have pain with chewing.   You are concerned about facial defects. SEEK IMMEDIATE MEDICAL CARE IF:  You have severe pain or a headache that is not relieved by medicine.   You have unusual sleepiness, confusion, or personality changes.   You throw up (vomit).   You have a persistent nosebleed.   You have double vision or blurred vision.   You have fluid drainage from your nose or ear.   You have difficulty walking or using your arms or legs.  MAKE SURE YOU:   Understand these instructions.  Will watch your condition.  Will get help right away if you are not doing well or get worse. Document Released: 09/22/2004 Document Revised: 06/05/2013 Document Reviewed: 03/28/2013 [facility] Patient Information 2015 [place], [place]. This information is not intended to replace advice given to you by your health care provider. Make sure you discuss any questions you have with your health care provider.  Laceration Care, Adult A laceration is a cut or lesion that goes through all layers of the skin and into the tissue just beneath the skin. TREATMENT  Some lacerations may not require closure. Some lacerations may not be able to be closed due to an increased risk of infection. It is important to see your caregiver as soon as possible after an injury to minimize the risk of infection and maximize the opportunity for successful closure. If closure is appropriate, pain medicines may be given, if needed. The wound will be cleaned to help prevent infection. Your caregiver will use stitches (sutures), staples, wound glue (adhesive), or skin adhesive strips to repair the laceration. These tools bring the skin edges together to allow for faster healing and a better cosmetic outcome. However, all wounds will heal with a scar. Once the wound has healed, scarring can be minimized by covering the wound with sunscreen during the day for 1 full year. HOME CARE  INSTRUCTIONS  For sutures or staples:  Keep the wound clean and dry.  If you were given a bandage (dressing), you should change it at least  once a day. Also, change the dressing if it becomes wet or dirty, or as directed by your caregiver.  Wash the wound with soap and water 2 times a day. Rinse the wound off with water to remove all soap. Pat the wound dry with a clean towel.  After cleaning, apply a thin layer of the antibiotic ointment as recommended by your caregiver. This will help prevent infection and keep the dressing from sticking.  You may shower as usual after the first 24 hours. Do not soak the wound in water until the sutures are removed.  Only take over-the-counter or prescription medicines for pain, discomfort, or fever as directed by your caregiver.  Get your sutures or staples removed as directed by your caregiver. For skin adhesive strips:  Keep the wound clean and dry.  Do not get the skin adhesive strips wet. You may bathe carefully, using caution to keep the wound dry.  If the wound gets wet, pat it dry with a clean towel.  Skin adhesive strips will fall off on their own. You may trim the strips as the wound heals. Do not remove skin adhesive strips that are still stuck to the wound. They will fall off in time. For wound adhesive:  You may briefly wet your wound in the shower or bath. Do not soak or scrub the wound. Do not swim. Avoid periods of heavy perspiration until the skin adhesive has fallen off on its own. After showering or bathing, gently pat the wound dry with a clean towel.  Do not apply liquid medicine, cream medicine, or ointment medicine to your wound while the skin adhesive is in place. This may loosen the film before your wound is healed.  If a dressing is placed over the wound, be careful not to apply tape directly over the skin adhesive. This may cause the adhesive to be pulled off before the wound is healed.  Avoid prolonged exposure to  sunlight or tanning lamps while the skin adhesive is in place. Exposure to ultraviolet light in the first year will darken the scar.  The skin adhesive will usually remain in place for 5 to 10 days, then naturally fall off the skin. Do not pick at the adhesive film. You may need a tetanus shot if:  You cannot remember when you had your last tetanus shot.  You have never had a tetanus shot. If you get a tetanus shot, your arm may swell, get red, and feel warm to the touch. This is common and not a problem. If you need a tetanus shot and you choose not to have one, there is a rare chance of getting tetanus. Sickness from tetanus can be serious. SEEK MEDICAL CARE IF:   You have redness, swelling, or increasing pain in the wound.  You see a red line that goes away from the wound.  You have yellowish-white fluid (pus) coming from the wound.  You have a fever.  You notice a bad smell coming from the wound or dressing.  Your wound breaks open before or after sutures have been removed.  You notice something coming out of the wound such as wood or glass.  Your wound is on your hand or foot and you cannot move a finger or toe. SEEK IMMEDIATE MEDICAL CARE IF:   Your pain is not controlled with prescribed medicine.  You have severe swelling around the wound causing pain and numbness or a change in color in your arm, hand, leg, or foot.  Your wound splits open and starts bleeding.  You have worsening numbness, weakness, or loss of function of any joint around or beyond the wound.  You develop painful lumps near the wound or on the skin anywhere on your body. MAKE SURE YOU:   Understand these instructions.  Will watch your condition.  Will get help right away if you are not doing well or get worse. Document Released: 08/15/2005 Document Revised: 11/07/2011 Document Reviewed: 02/08/2011 [facility] Patient Information 2015 [place]. This information is not intended to replace advice  given to you by your health care provider. Make sure you discuss any questions you have with your health care provider.  Hematoma A hematoma is a collection of blood under the skin, in an organ, in a body space, in a joint space, or in other tissue. The blood can clot to form a lump that you can see and feel. The lump is often firm and may sometimes become sore and tender. Most hematomas get better in a few days to weeks. However, some hematomas may be serious and require medical care. Hematomas can range in size from very small to very large. CAUSES  A hematoma can be caused by a blunt or penetrating injury. It can also be caused by spontaneous leakage from a blood vessel under the skin. Spontaneous leakage from a blood vessel is more likely to occur in older people, especially those taking blood thinners. Sometimes, a hematoma can develop after certain medical procedures. SIGNS AND SYMPTOMS   A firm lump on the body.  Possible pain and tenderness in the area.  Bruising.Blue, dark blue, purple-red, or yellowish skin may appear at the site of the hematoma if the hematoma is close to the surface of the skin. For hematomas in deeper tissues or body spaces, the signs and symptoms may be subtle. For example, an intra-abdominal hematoma may cause abdominal pain, weakness, fainting, and shortness of breath. An intracranial hematoma may cause a headache or symptoms such as weakness, trouble speaking, or a change in consciousness. DIAGNOSIS  A hematoma can usually be diagnosed based on your medical history and a physical exam. Imaging tests may be needed if your health care provider suspects a hematoma in deeper tissues or body spaces, such as the abdomen, head, or chest. These tests may include ultrasonography or a CT scan.  TREATMENT  Hematomas usually go away on their own over time. Rarely does the blood need to be drained out of the body. Large hematomas or those that may affect vital organs will  sometimes need surgical drainage or monitoring. HOME CARE INSTRUCTIONS   Apply ice to the injured area:   Put ice in a plastic bag.   Place a towel between your skin and the bag.   Leave the ice on for 20 minutes, 2-3 times a day for the first 1 to 2 days.   After the first 2 days, switch to using warm compresses on the hematoma.   Elevate the injured area to help decrease pain and swelling. Wrapping the area with an elastic bandage may also be helpful. Compression helps to reduce swelling and promotes shrinking of the hematoma. Make sure the bandage is not wrapped too tight.   If your hematoma is on a lower extremity and is painful, crutches may be helpful for a couple days.   Only take over-the-counter or prescription medicines as directed by your health care provider. SEEK IMMEDIATE MEDICAL CARE IF:   You have increasing pain, or your pain  is not controlled with medicine.   You have a fever.   You have worsening swelling or discoloration.   Your skin over the hematoma breaks or starts bleeding.   Your hematoma is in your chest or abdomen and you have weakness, shortness of breath, or a change in consciousness.  Your hematoma is on your scalp (caused by a fall or injury) and you have a worsening headache or a change in alertness or consciousness. MAKE SURE YOU:   Understand these instructions.  Will watch your condition.  Will get help right away if you are not doing well or get worse. Document Released: 03/29/2004 Document Revised: 04/17/2013 Document Reviewed: 01/23/2013 [facility] Patient Information 2015 [place], [place]. This information is not intended to replace advice given to you by your health care provider. Make sure you discuss any questions you have with your health care provider.

## 2014-06-12 ENCOUNTER — Ambulatory Visit (INDEPENDENT_AMBULATORY_CARE_PROVIDER_SITE_OTHER): Payer: Managed Care, Other (non HMO) | Admitting: Family Medicine

## 2014-06-12 ENCOUNTER — Encounter: Payer: Self-pay | Admitting: Family Medicine

## 2014-06-12 VITALS — BP 110/80 | Temp 98.1°F | Wt 212.0 lb

## 2014-06-12 DIAGNOSIS — S0101XA Laceration without foreign body of scalp, initial encounter: Secondary | ICD-10-CM | POA: Insufficient documentation

## 2014-06-12 DIAGNOSIS — S0101XD Laceration without foreign body of scalp, subsequent encounter: Secondary | ICD-10-CM

## 2014-06-12 NOTE — Progress Notes (Signed)
Pre visit review using our clinic review tool, if applicable. No additional management support is needed unless otherwise documented below in the visit note. 

## 2014-06-12 NOTE — Progress Notes (Signed)
   Subjective:    Patient ID: Jeff BalsamDaniel L Hawkins, male    DOB: Jan 25, 1956, 58 y.o.   MRN: [id number]  HPI Jeff Hawkins is a 58 year old male who comes in today for staple removal  A week ago he fell off his horse and sustained a bruise to his right shoulder and a one-inch laceration to his scalp. He went emerge from and had 3 staples put in. He's here today to remove the staples.  Shoulder still sore   Review of Systems    review of systems otherwise negative Objective:   Physical Exam  Well-developed well-nourished male no acute distress vital signs stable he is afebrile the staples x3 removed without complications  He has fairly full range of motion of that right shoulder. A superficial abrasion that is healing without infection      Assessment & Plan:  Suture removal x3 scalp laceration  Abrasion right shoulder Motrin 400 twice a day ice and range of motion exercises twice daily

## 2014-06-12 NOTE — Patient Instructions (Addendum)
Motrin 400 mg twice daily with food  Range of motion exercises twice daily  Return when necessary  Set up a time this fall for your physical exam  Fasting labs one week prior

## 2014-06-13 ENCOUNTER — Other Ambulatory Visit: Payer: Self-pay

## 2014-06-30 ENCOUNTER — Telehealth: Payer: Self-pay | Admitting: Family Medicine

## 2014-06-30 MED ORDER — HYDROCHLOROTHIAZIDE 25 MG PO TABS: 25.0000 mg | ORAL_TABLET | Freq: Every day | ORAL | Status: DC

## 2014-06-30 NOTE — Telephone Encounter (Signed)
Rx sent 

## 2014-06-30 NOTE — Telephone Encounter (Signed)
[facility] [phone] - [place], [place] - [address] is requesting 90 day re-fill on hydrochlorothiazide (HYDRODIURIL) 25 MG tablet

## 2014-07-21 ENCOUNTER — Ambulatory Visit (INDEPENDENT_AMBULATORY_CARE_PROVIDER_SITE_OTHER): Payer: Managed Care, Other (non HMO) | Admitting: Family Medicine

## 2014-07-21 ENCOUNTER — Encounter: Payer: Self-pay | Admitting: Family Medicine

## 2014-07-21 ENCOUNTER — Ambulatory Visit: Payer: Managed Care, Other (non HMO) | Admitting: Family Medicine

## 2014-07-21 VITALS — BP 130/80 | Temp 97.9°F | Ht 72.0 in | Wt 206.0 lb

## 2014-07-21 DIAGNOSIS — M5442 Lumbago with sciatica, left side: Secondary | ICD-10-CM

## 2014-07-21 DIAGNOSIS — M5440 Lumbago with sciatica, unspecified side: Secondary | ICD-10-CM | POA: Insufficient documentation

## 2014-07-21 DIAGNOSIS — Z0000 Encounter for general adult medical examination without abnormal findings: Secondary | ICD-10-CM

## 2014-07-21 DIAGNOSIS — Z23 Encounter for immunization: Secondary | ICD-10-CM

## 2014-07-21 DIAGNOSIS — S0101XD Laceration without foreign body of scalp, subsequent encounter: Secondary | ICD-10-CM

## 2014-07-21 LAB — LIPID PANEL
Cholesterol: 151 mg/dL (ref 0–200)
HDL: 60 mg/dL (ref >39.00)
LDL CALC: 72 mg/dL (ref 0–99)
NONHDL: 91
TRIGLYCERIDES: 95 mg/dL (ref 0.0–149.0)
Total CHOL/HDL Ratio: 3
VLDL: 19 mg/dL (ref 0.0–40.0)

## 2014-07-21 LAB — HEPATIC FUNCTION PANEL
ALT: 27 U/L (ref 0–53)
AST: 25 U/L (ref 0–37)
Albumin: 4.3 g/dL (ref 3.5–5.2)
Alkaline Phosphatase: 42 U/L (ref 39–117)
BILIRUBIN TOTAL: 0.6 mg/dL (ref 0.2–1.2)
Bilirubin, Direct: 0.1 mg/dL (ref 0.0–0.3)
Total Protein: 7.3 g/dL (ref 6.0–8.3)

## 2014-07-21 LAB — BASIC METABOLIC PANEL
BUN: 13 mg/dL (ref 6–23)
CALCIUM: 9.9 mg/dL (ref 8.4–10.5)
CO2: 26 meq/L (ref 19–32)
Chloride: 106 mEq/L (ref 96–112)
Creatinine, Ser: 1.1 mg/dL (ref 0.4–1.5)
GFR: 75.64 mL/min (ref >60.00)
GLUCOSE: 83 mg/dL (ref 70–99)
Potassium: 4.7 mEq/L (ref 3.5–5.1)
Sodium: 141 mEq/L (ref 135–145)

## 2014-07-21 LAB — CBC WITH DIFFERENTIAL/PLATELET
BASOS ABS: 0 10*3/uL (ref 0.0–0.1)
BASOS PCT: 0.5 % (ref 0.0–3.0)
EOS ABS: 0.1 10*3/uL (ref 0.0–0.7)
Eosinophils Relative: 1.2 % (ref 0.0–5.0)
HCT: 41.6 % (ref 39.0–52.0)
Hemoglobin: 13.7 g/dL (ref 13.0–17.0)
LYMPHS PCT: 25.2 % (ref 12.0–46.0)
Lymphs Abs: 2.1 10*3/uL (ref 0.7–4.0)
MCHC: 33 g/dL (ref 30.0–36.0)
MCV: 95.3 fl (ref 78.0–100.0)
MONO ABS: 0.7 10*3/uL (ref 0.1–1.0)
Monocytes Relative: 7.9 % (ref 3.0–12.0)
NEUTROS PCT: 65.2 % (ref 43.0–77.0)
Neutro Abs: 5.4 10*3/uL (ref 1.4–7.7)
PLATELETS: 335 10*3/uL (ref 150.0–400.0)
RBC: 4.37 Mil/uL (ref 4.22–5.81)
RDW: 14 % (ref 11.5–15.5)
WBC: 8.2 10*3/uL (ref 4.0–10.5)

## 2014-07-21 LAB — POCT URINALYSIS DIPSTICK
Bilirubin, UA: NEGATIVE
Blood, UA: NEGATIVE
GLUCOSE UA: NEGATIVE
Ketones, UA: NEGATIVE
Leukocytes, UA: NEGATIVE
NITRITE UA: NEGATIVE
PROTEIN UA: NEGATIVE
Spec Grav, UA: 1.01
UROBILINOGEN UA: 0.2
pH, UA: 5

## 2014-07-21 LAB — TSH: TSH: 1.96 u[IU]/mL (ref 0.35–4.50)

## 2014-07-21 LAB — PSA: PSA: 0.44 ng/mL (ref 0.10–4.00)

## 2014-07-21 MED ORDER — TRAMADOL HCL 50 MG PO TABS: 50.0000 mg | ORAL_TABLET | Freq: Three times a day (TID) | ORAL | Status: DC | PRN

## 2014-07-21 MED ORDER — PREDNISONE 20 MG PO TABS: ORAL_TABLET | ORAL | Status: DC

## 2014-07-21 MED ORDER — CYCLOBENZAPRINE HCL 5 MG PO TABS: 5.0000 mg | ORAL_TABLET | Freq: Three times a day (TID) | ORAL | Status: DC | PRN

## 2014-07-21 MED ORDER — KETOROLAC TROMETHAMINE 60 MG/2ML IM SOLN
60.0000 mg | Freq: Once | INTRAMUSCULAR | Status: AC
  Administered 2014-07-21: 60 mg via INTRAMUSCULAR

## 2014-07-21 NOTE — Progress Notes (Signed)
Pre visit review using our clinic review tool, if applicable. No additional management support is needed unless otherwise documented below in the visit note. 

## 2014-07-21 NOTE — Patient Instructions (Signed)
Bedrest today Tuesday Wednesday.......... Thursday walk,,,,,, lie down,,,,,,,,, no sitting  Flexeril and tramadol....... one of each 3 times daily while your at  bedrest today Tuesday Wednesday  On Thursday just take one of each at bedtime  Stool softener daily  Heating pad low heat  No lifting no driving and no travel  Return next Monday for follow-up

## 2014-07-21 NOTE — Progress Notes (Signed)
   Subjective:    Patient ID: Jeff Hawkins, male    DOB: 1956-02-16, 58 y.o.   MRN: [id number]  HPI Jeff Hawkins is a 58 year old male married nonsmoker who comes in today become P by his wife for evaluation of severe back pain. He was scheduled for his physical examination today but he came in because of the back pain  He says his back was bothering little bit last week then he woke up on Sunday morning and his pain was an 8 on a scale of 1-10  He says the pain is constant sometimes sharp sometimes dull is in the left side of his back and goes down to his left knee. The pain is decreased by bending and been increased by sitting he's tried Tylenol and Aleve with no effect.  He's never had a back problems like this in the past  He played high school football but does not recall any trauma. 4 weeks ago he fell off of a horse   Review of Systems    review of systems otherwise negative.. No bowel or bladder dysfunction Objective:   Physical Exam  Well-developed well-nourished male in acute pain  In the supine position which he assumes very guardedly his legs reveal equal length except his left leg as a quarter inch longer than the right which is not significant. Sensation muscle strength reflexes all within normal limits. Straight leg raising positive left 45      Assessment & Plan:  Lumbar disc disease,,,,,,,,,, L4-L5 distribution,,,,, with normal neurologic exam,,,,,,,,,, bedrest for 3 days medication physical therapy follow-up in one week

## 2014-07-28 ENCOUNTER — Ambulatory Visit (INDEPENDENT_AMBULATORY_CARE_PROVIDER_SITE_OTHER)
Admission: RE | Admit: 2014-07-28 | Discharge: 2014-07-28 | Disposition: A | Discharge status: Home / Self Care | Payer: Managed Care, Other (non HMO) | Source: Ambulatory Visit | Attending: Family Medicine | Admitting: Family Medicine

## 2014-07-28 ENCOUNTER — Encounter: Payer: Self-pay | Admitting: Family Medicine

## 2014-07-28 ENCOUNTER — Ambulatory Visit (INDEPENDENT_AMBULATORY_CARE_PROVIDER_SITE_OTHER): Payer: Managed Care, Other (non HMO) | Admitting: Family Medicine

## 2014-07-28 VITALS — BP 150/110

## 2014-07-28 DIAGNOSIS — M5442 Lumbago with sciatica, left side: Secondary | ICD-10-CM

## 2014-07-28 DIAGNOSIS — I10 Essential (primary) hypertension: Secondary | ICD-10-CM

## 2014-07-28 DIAGNOSIS — E785 Hyperlipidemia, unspecified: Secondary | ICD-10-CM

## 2014-07-28 NOTE — Progress Notes (Signed)
   Subjective:    Patient ID: Minta BalsamDaniel L Dible, male    DOB: 09/17/1955, 58 y.o.   MRN: [id number]  HPI Reuel BoomDaniel is a 58 year old male who comes in today for follow-up of acute low back pain  We saw him last week placement bedrest for 3 days started him on 40 mg of prednisone with a taper and Flexeril and tramadol for pain. Says his pain now varies from a fortuitous 7. Note numbness or weakness. Normal bowel and bladder function.   Review of Systems    review of systems otherwise negative Objective:   Physical Exam  Well-developed well-nourished male no acute distress vital signs stable he is afebrile in the supine position the sensation muscle strength reflexes all within normal limits. Straight leg raising positive left leg 45      Assessment & Plan:  Acute low back pain........ lumbar disc disease,,,, without neurologic deficit........ continue conservative therapy..... Begin physical therapy........ x-ray spine..... Follow-up on Thursday

## 2014-07-28 NOTE — Patient Instructions (Signed)
Taper the prednisone as outlined  Flexeril and tramadol........ one of each 3 times daily  Avoid sitting  X-rays of your back today  Physical therapy ASAP  Return on Thursday for follow-up

## 2014-07-28 NOTE — Progress Notes (Signed)
Pre visit review using our clinic review tool, if applicable. No additional management support is needed unless otherwise documented below in the visit note. 

## 2014-07-29 ENCOUNTER — Other Ambulatory Visit: Payer: Self-pay | Admitting: Family Medicine

## 2014-07-29 ENCOUNTER — Telehealth: Payer: Self-pay | Admitting: Family Medicine

## 2014-07-29 NOTE — Telephone Encounter (Signed)
Patient is aware 

## 2014-07-29 NOTE — Telephone Encounter (Signed)
Pt needs back xray results

## 2014-07-30 ENCOUNTER — Ambulatory Visit: Payer: Managed Care, Other (non HMO) | Attending: Family Medicine | Admitting: Physical Therapy

## 2014-07-30 DIAGNOSIS — M5416 Radiculopathy, lumbar region: Secondary | ICD-10-CM | POA: Diagnosis not present

## 2014-07-31 ENCOUNTER — Ambulatory Visit: Payer: Managed Care, Other (non HMO) | Admitting: Physical Therapy

## 2014-07-31 ENCOUNTER — Ambulatory Visit (INDEPENDENT_AMBULATORY_CARE_PROVIDER_SITE_OTHER): Payer: Managed Care, Other (non HMO) | Admitting: Family Medicine

## 2014-07-31 ENCOUNTER — Encounter: Payer: Self-pay | Admitting: Family Medicine

## 2014-07-31 VITALS — BP 130/90 | Temp 98.2°F | Wt 201.0 lb

## 2014-07-31 DIAGNOSIS — M5416 Radiculopathy, lumbar region: Secondary | ICD-10-CM | POA: Diagnosis not present

## 2014-07-31 DIAGNOSIS — M5442 Lumbago with sciatica, left side: Secondary | ICD-10-CM

## 2014-07-31 NOTE — Progress Notes (Signed)
   Subjective:    Patient ID: Jeff Hawkins, male    DOB: 1956-08-23, 58 y.o.   MRN: [id number]  HPI Jeff Hawkins is a 58 year old male who comes in today compromise wife for evaluation of low back pain  We been working with him for the last couple weeks. His back pain which was reading down to his left knee now is going only to his left hip. He had his second physical therapy session today would seem to help a lot. He's taken Flexeril and tramadol 3 times daily and his taper down his prednisone to half a tablet Monday Wednesday Friday.   Review of Systems Review of systems otherwise negative again no bowel or bladder dysfunction    Objective:   Physical Exam Well-developed well-nourished male no acute distress vital signs stable he is afebrile he assumes the supine position and much less pain than he had before. Again legs reveal equal length sensation muscle strength reflexes all within normal limits positive straight leg raising 45 right and left refer to the left       Assessment & Plan:  Lumbar disc disease improving with conservative therapy,,,,,,,, continue conservative therapy follow-up with me in 2 weeks

## 2014-07-31 NOTE — Progress Notes (Signed)
Pre visit review using our clinic review tool, if applicable. No additional management support is needed unless otherwise documented below in the visit note. 

## 2014-07-31 NOTE — Patient Instructions (Signed)
Motrin 600 mg twice daily with food  Flexeril and tramadol....... One of each 3 times daily  Continue physical therapy  Follow up with me in 2 weeks

## 2014-08-04 ENCOUNTER — Ambulatory Visit: Payer: Managed Care, Other (non HMO) | Admitting: Physical Therapy

## 2014-08-04 DIAGNOSIS — M5416 Radiculopathy, lumbar region: Secondary | ICD-10-CM | POA: Diagnosis not present

## 2014-08-06 ENCOUNTER — Ambulatory Visit: Payer: Managed Care, Other (non HMO) | Admitting: Physical Therapy

## 2014-08-06 DIAGNOSIS — M5416 Radiculopathy, lumbar region: Secondary | ICD-10-CM | POA: Diagnosis not present

## 2014-08-08 ENCOUNTER — Ambulatory Visit: Payer: Managed Care, Other (non HMO) | Admitting: Physical Therapy

## 2014-08-08 DIAGNOSIS — M5416 Radiculopathy, lumbar region: Secondary | ICD-10-CM | POA: Diagnosis not present

## 2014-08-12 ENCOUNTER — Ambulatory Visit: Payer: Managed Care, Other (non HMO) | Admitting: Physical Therapy

## 2014-08-12 DIAGNOSIS — M5416 Radiculopathy, lumbar region: Secondary | ICD-10-CM | POA: Diagnosis not present

## 2014-08-14 ENCOUNTER — Ambulatory Visit: Payer: Managed Care, Other (non HMO)

## 2014-08-14 DIAGNOSIS — M5416 Radiculopathy, lumbar region: Secondary | ICD-10-CM | POA: Diagnosis not present

## 2014-08-15 ENCOUNTER — Ambulatory Visit: Payer: Managed Care, Other (non HMO) | Admitting: Physical Therapy

## 2014-08-15 DIAGNOSIS — M5416 Radiculopathy, lumbar region: Secondary | ICD-10-CM | POA: Diagnosis not present

## 2014-08-19 ENCOUNTER — Ambulatory Visit: Payer: Managed Care, Other (non HMO) | Admitting: Physical Therapy

## 2014-08-20 ENCOUNTER — Ambulatory Visit: Payer: Managed Care, Other (non HMO) | Admitting: Physical Therapy

## 2014-08-20 DIAGNOSIS — M5416 Radiculopathy, lumbar region: Secondary | ICD-10-CM | POA: Diagnosis not present

## 2014-08-26 ENCOUNTER — Ambulatory Visit: Payer: Managed Care, Other (non HMO)

## 2014-08-26 DIAGNOSIS — M5416 Radiculopathy, lumbar region: Secondary | ICD-10-CM | POA: Diagnosis not present

## 2014-08-27 ENCOUNTER — Ambulatory Visit: Payer: Managed Care, Other (non HMO)

## 2014-08-28 ENCOUNTER — Ambulatory Visit: Payer: Managed Care, Other (non HMO) | Admitting: Physical Therapy

## 2014-08-28 ENCOUNTER — Encounter: Payer: Self-pay | Admitting: Family Medicine

## 2014-08-28 ENCOUNTER — Ambulatory Visit (INDEPENDENT_AMBULATORY_CARE_PROVIDER_SITE_OTHER): Payer: Managed Care, Other (non HMO) | Admitting: Family Medicine

## 2014-08-28 VITALS — BP 120/84 | Temp 97.5°F | Ht 73.25 in | Wt 213.0 lb

## 2014-08-28 DIAGNOSIS — E785 Hyperlipidemia, unspecified: Secondary | ICD-10-CM

## 2014-08-28 DIAGNOSIS — M5416 Radiculopathy, lumbar region: Secondary | ICD-10-CM | POA: Diagnosis not present

## 2014-08-28 DIAGNOSIS — Z23 Encounter for immunization: Secondary | ICD-10-CM

## 2014-08-28 DIAGNOSIS — I10 Essential (primary) hypertension: Secondary | ICD-10-CM

## 2014-08-28 DIAGNOSIS — Z0000 Encounter for general adult medical examination without abnormal findings: Secondary | ICD-10-CM

## 2014-08-28 MED ORDER — ROSUVASTATIN CALCIUM 20 MG PO TABS: 20.0000 mg | ORAL_TABLET | Freq: Every day | ORAL | Status: DC

## 2014-08-28 MED ORDER — LISINOPRIL 20 MG PO TABS: 20.0000 mg | ORAL_TABLET | Freq: Every day | ORAL | Status: DC

## 2014-08-28 MED ORDER — HYDROCHLOROTHIAZIDE 25 MG PO TABS: 25.0000 mg | ORAL_TABLET | Freq: Every day | ORAL | Status: DC

## 2014-08-28 NOTE — Progress Notes (Signed)
   Subjective:    Patient ID: Jeff Hawkins, male    DOB: Oct 29, 1955, 58 y.o.   MRN: [id number]  HPI Jeff Hawkins is a 58 year old married male nonsmoker who comes in today for general physical examination because of a history of hypertension, hyperlipidemia, coronary artery disease, recent history of lumbar disc disease  Medications are accurate. He's not on the Flexeril or the pain medicine anymore for the lumbar disc disease. He does take tramadol 50 mg twice a day when necessary. His last physical therapy session was today. He feels he is 90% pain-free  He saw Dr. Katherene PontoMcElroy any in cardiology stress test normal  He gets routine eye care, dental care, last colonoscopy 10 years ago at [facility]......Marland Kitchen. we'll refer him for colonoscopy here with her folks  Vaccinations updated except he needs a shingles vaccine.   Review of Systems  Constitutional: Negative.   HENT: Negative.   Eyes: Negative.   Respiratory: Negative.   Cardiovascular: Negative.   Gastrointestinal: Negative.   Endocrine: Negative.   Genitourinary: Negative.   Musculoskeletal: Negative.   Skin: Negative.   Allergic/Immunologic: Negative.   Neurological: Negative.   Hematological: Negative.   Psychiatric/Behavioral: Negative.        Objective:   Physical Exam  Constitutional: He is oriented to person, place, and time. He appears well-developed and well-nourished.  HENT:  Head: Normocephalic and atraumatic.  Right Ear: External ear normal.  Left Ear: External ear normal.  Nose: Nose normal.  Mouth/Throat: Oropharynx is clear and moist.  Eyes: Conjunctivae and EOM are normal. Pupils are equal, round, and reactive to light.  Neck: Normal range of motion. Neck supple. No JVD present. No tracheal deviation present. No thyromegaly present.  Cardiovascular: Normal rate, regular rhythm, normal heart sounds and intact distal pulses.  Exam reveals no gallop and no friction rub.   No murmur heard. No carotid nor aortic bruits  peripheral pulses 2+ and symmetrical  Pulmonary/Chest: Effort normal and breath sounds normal. No stridor. No respiratory distress. He has no wheezes. He has no rales. He exhibits no tenderness.  Abdominal: Soft. Bowel sounds are normal. He exhibits no distension and no mass. There is no tenderness. There is no rebound and no guarding.  Genitourinary: Rectum normal, prostate normal and penis normal. Guaiac negative stool. No penile tenderness.  Musculoskeletal: Normal range of motion. He exhibits no edema or tenderness.  Lymphadenopathy:    He has no cervical adenopathy.  Neurological: He is alert and oriented to person, place, and time. He has normal reflexes. No cranial nerve deficit. He exhibits normal muscle tone.  Skin: Skin is warm and dry. No rash noted. No erythema. No pallor.  Total body skin exam normal  Psychiatric: He has a normal mood and affect. His behavior is normal. Judgment and thought content normal.  Nursing note and vitals reviewed.         Assessment & Plan:  History of coronary disease.........Marland Kitchen. Asymptomatic..... Continue follow-up in cardiology  Hypertension ago continue current therapy  Hyperlipidemia on Crestor 20 mg and aspirin daily LDL cholesterols 72 at goal continue current therapy  Lumbar disc disease......Marland Kitchen. resolving with conservative therapy and physical therapy.

## 2014-08-28 NOTE — Patient Instructions (Signed)
Continue current medications  Follow-up in one year sooner if any problems  Recommendations shingles vaccine  Also recommend a screening colonoscopy

## 2014-08-28 NOTE — Progress Notes (Signed)
Pre visit review using our clinic review tool, if applicable. No additional management support is needed unless otherwise documented below in the visit note. 

## 2014-09-15 ENCOUNTER — Telehealth: Payer: Self-pay | Admitting: Family Medicine

## 2014-09-15 ENCOUNTER — Other Ambulatory Visit: Payer: Self-pay | Admitting: Family Medicine

## 2014-09-15 DIAGNOSIS — M5442 Lumbago with sciatica, left side: Secondary | ICD-10-CM

## 2014-09-15 NOTE — Telephone Encounter (Signed)
error 

## 2014-09-29 ENCOUNTER — Encounter: Payer: Self-pay | Admitting: Internal Medicine

## 2014-10-03 ENCOUNTER — Other Ambulatory Visit: Payer: Self-pay | Admitting: Family Medicine

## 2014-10-31 ENCOUNTER — Telehealth: Payer: Self-pay | Admitting: Cardiovascular Disease

## 2014-10-31 NOTE — Telephone Encounter (Signed)
New Message        Pt's wife calling stating that pt is needing to be seen by Dr. Clifton JamesMcAlhany as soon as possible. [place] is returning from the [place] and will only be here the week of March 14-18. Pt had some medical issues while in the [place] and was told to f/u w/ his cardiologist as soon as he got [place] the [place]. There is nothing available w/ Dr. Clifton JamesMcAlhany or a PA. Please call wife back and advise.

## 2014-10-31 NOTE — Telephone Encounter (Signed)
[facility], Appt. scheduled for March 14th, 10:30am with Norma FredricksonLori Gerhardt, NP, Dr. Clifton JamesMcAlhany is here in office that day with no open appts. Lawson FiscalLori is the flex that day. GL

## 2014-11-03 NOTE — Telephone Encounter (Signed)
Thanks. cdm 

## 2014-11-04 NOTE — Telephone Encounter (Signed)
Left message to call back  

## 2014-11-04 NOTE — Telephone Encounter (Signed)
Pt's wife returned call and confirmed she is aware of appt on November 10, 2014.

## 2014-11-10 ENCOUNTER — Ambulatory Visit (INDEPENDENT_AMBULATORY_CARE_PROVIDER_SITE_OTHER): Payer: Managed Care, Other (non HMO) | Admitting: Nurse Practitioner

## 2014-11-10 ENCOUNTER — Encounter: Payer: Self-pay | Admitting: Nurse Practitioner

## 2014-11-10 VITALS — BP 130/90 | HR 61 | Ht 73.0 in | Wt 210.6 lb

## 2014-11-10 DIAGNOSIS — I6200 Nontraumatic subdural hemorrhage, unspecified: Secondary | ICD-10-CM

## 2014-11-10 DIAGNOSIS — S065XAA Traumatic subdural hemorrhage with loss of consciousness status unknown, initial encounter: Secondary | ICD-10-CM

## 2014-11-10 DIAGNOSIS — I10 Essential (primary) hypertension: Secondary | ICD-10-CM

## 2014-11-10 DIAGNOSIS — S065X9A Traumatic subdural hemorrhage with loss of consciousness of unspecified duration, initial encounter: Secondary | ICD-10-CM

## 2014-11-10 DIAGNOSIS — I25810 Atherosclerosis of coronary artery bypass graft(s) without angina pectoris: Secondary | ICD-10-CM

## 2014-11-10 DIAGNOSIS — E785 Hyperlipidemia, unspecified: Secondary | ICD-10-CM

## 2014-11-10 NOTE — Progress Notes (Signed)
CARDIOLOGY OFFICE NOTE  Date:  11/10/2014    Jeff Hawkins Date of Birth: April 21, 1956 Medical Record #[id number]  PCP:  Evette Georges, MD  Cardiologist:  Clifton James    Chief Complaint  Patient presents with  . Medication Problem    Work in visit to discuss restarting aspirin. Seen for Dr. Clifton James.      History of Present Illness: Jeff Hawkins is a 59 y.o. male who presents today for a work in visit. Seen for Dr. Clifton James. He has a history of CAD, status post drug-eluting stent to the mid LAD in February 2011, HTN and hyperlipidemia. He was readmitted fall of 2011 with CP. He had elevated CK-MBs but his troponins were negative. Cardiac catheterization demonstrated a patent stent in the LAD. He had a septal perforator coming off of the stent with a stable 80% stenosis and an 80% stenosis in a small ramus intermediate. Medical therapy was pursued. He moved to [place] in September 2014.   He was last seen here a little over one year ago - endorsed DOE - Plavix restarted - Myoview updated.   Phone call earlier this month -"Pt's wife calling stating that pt is needing to be seen by Dr. Clifton James as soon as possible. Pt is returning from the [place] and will only be here the week of March 14-18. Pt had some medical issues while in the [place] and was told to f/u w/ his cardiologist as soon as he got back to the [place]. There is nothing available w/ Dr. Clifton James or a PA. Please call wife back and advise." Thus added to my schedule.   He brings in records that detail the following: He fell off a horse back in October - this happened in the [place]. 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 Aspirin stopped.Had also been taking ibuprofen for back pain.  Seen back with repeat CT one week later and was advised to be admitted for surgery - had left burr hold drainage and subdural drain placement on September 22, 2014.      Comes back today. Here with his wife.Wants to discuss restarting his aspirin. No active chest pain. Not short of breath. Due to socialized medicine - he is on a different diuretic for his HTN. Has been off Plavix since August - had "bumped his knee" - had lots of bleeding/bruised and it was stopped.  His cardiac status is ok. No chest pain. Not short of breathing. Has been walking more since he was not able to drive after his surgery. No planned follow up with neurosurgery in [place]. He is on a different medicine in the place of HCTZ due to available. Seeing Dr. Tawanna Cooler tomorrow. Has not had follow up scan since his surgery.    Past Medical History  Diagnosis Date  . Coronary artery disease 10/22/09     99% proximal to mid LAD, s/p DES x 1.  . CAD (coronary artery disease) 07/2010    septal perf 80%; RI (small) 80%; LAD stent ok; med tx  . Hyperlipidemia   . Hypertension   . Diverticulosis of colon     Past Surgical History  Procedure Laterality Date  . Ear implant-stapes replacement    . Ear surgeries  R 1986, L 2003  . Right knee meniscus removal    . Left knee surgery remotely       Medications: Current  Outpatient Prescriptions  Medication Sig Dispense Refill  . BENDROFLUMETHIAZIDE PO Take 2.5 mg by mouth daily.    Marland Kitchen. lisinopril (PRINIVIL,ZESTRIL) 20 MG tablet Take 1 tablet (20 mg total) by mouth daily. 100 tablet 3  . nitroGLYCERIN (NITROSTAT) 0.4 MG SL tablet Place 0.4 mg under the tongue every 5 (five) minutes as needed for chest pain.    . rosuvastatin (CRESTOR) 20 MG tablet Take 1 tablet (20 mg total) by mouth daily. 100 tablet 3  . zolpidem (AMBIEN) 5 MG tablet TAKE 1 TABLET BY MOUTH EVERY NIGHT AT BEDTIME. 30 tablet 5   No current facility-administered medications for this visit.    Allergies: No Known Allergies  Social History: The patient  reports that he quit smoking about 13 years ago. He has never used smokeless tobacco. He reports that he drinks about 0.5 oz of  alcohol per week. He reports that he does not use illicit drugs.   Family History: The patient's family history includes Coronary artery disease in his brother, father, and other; Diabetes in his other; Heart disease in his father; Hypertension in his other.   Review of Systems: Please see the history of present illness.  He has back pain.   All other systems are reviewed and negative.   Physical Exam: VS:  BP 130/90 mmHg  Pulse 61  Ht 6\' 1"  (1.854 m)  Wt 210 lb 9.6 oz (95.528 kg)  BMI 27.79 kg/m2 .  BMI Body mass index is 27.79 kg/(m^2).  Wt Readings from Last 3 Encounters:  11/10/14 210 lb 9.6 oz (95.528 kg)  08/28/14 213 lb (96.616 kg)  07/31/14 201 lb (91.173 kg)    General: Pleasant. Well developed, well nourished and in no acute distress.  HEENT: Normal. Neck: Supple, no JVD, carotid bruits, or masses noted.  Cardiac: Regular rate and rhythm. No murmurs, rubs, or gallops. No edema.  Respiratory:  Lungs are clear to auscultation bilaterally with normal work of breathing.  GI: Soft and nontender.  MS: No deformity or atrophy. Gait and ROM intact. Skin: Warm and dry. Color is normal.  Neuro:  Strength and sensation are intact and no gross focal deficits noted.  Psych: Alert, appropriate and with normal affect.   LABORATORY DATA:  EKG:  EKG is ordered today. This demonstrates NSR. Reviewed with Dr. Clifton JamesMcAlhany.   Lab Results  Component Value Date   WBC 8.2 07/21/2014   HGB 13.7 07/21/2014   HCT 41.6 07/21/2014   PLT 335.0 07/21/2014   GLUCOSE 83 07/21/2014   CHOL 151 07/21/2014   TRIG 95.0 07/21/2014   HDL 60.00 07/21/2014   LDLDIRECT 153.3 07/09/2010   LDLCALC 72 07/21/2014   ALT 27 07/21/2014   AST 25 07/21/2014   NA 141 07/21/2014   K 4.7 07/21/2014   CL 106 07/21/2014   CREATININE 1.1 07/21/2014   BUN 13 07/21/2014   CO2 26 07/21/2014   TSH 1.96 07/21/2014   PSA 0.44 07/21/2014   INR 1.00 08/12/2010    BNP (last 3 results) No results for input(s): BNP  in the last 8760 hours.  ProBNP (last 3 results) No results for input(s): PROBNP in the last 8760 hours.   Other Studies Reviewed Today:  Impression Exercise Capacity: There is fairly good exercise tolerance. BP Response: Normal blood pressure response. Clinical Symptoms: Patient was limited by fatigue ECG Impression: No significant ST segment change suggestive of ischemia. Comparison with Prior Nuclear Study: No images to compare  Overall Impression: This is a low-risk scan.  However there is suggestion of a small area of scar with mild peri-infarct ischemia affecting the mid/apical inferior segments and the mid/apical inferolateral segments.  LV Ejection Fraction: 56%. LV Wall Motion: Overall wall motion is good. There are no obvious focal wall motion abnormalities.  Willa Rough, MD    Assessment/Plan: 1. CAD: He has a mid LAD stent. Stable Myoview a year ago. No longer on aspirin due to SDH requiring surgical evacuation.  2. Hyperlipidemia: Continue statin. Repeat lipids and LFTs this summer  3. HTN: BP stable. No changes made  4. SDH: S/P left burr hold with drain placement - no longer on his aspirin or NSAID. Discussed with Dr. Clifton James. We do not feel that we can safely restart his aspirin at this time. He may benefit from neurosurgery/neuro visit here in [place]. While he is currently totally asymptomatic - we do not know what the status is at this time. He is to stay OFF aspirin for now.           Current medicines are reviewed with the patient today.  The patient does not have concerns regarding medicines other than what has been noted above.  The following changes have been made:  See above.  Labs/ tests ordered today include:   No orders of the defined types were placed in this encounter.     Disposition:   FU with  in June with Dr. Clifton James for his routine visit.   Patient is agreeable to this plan and will call if any problems develop in the  interim.   Signed: Rosalio Macadamia, RN, ANP-C 11/10/2014 10:58 AM  [facility] [address] [place], [place]  [address] Phone: [phone] Fax: [phone]

## 2014-11-10 NOTE — Patient Instructions (Addendum)
Stay on your current medicines  Stay off the aspirin  Dr. Clifton JamesMcAlhany to see back in June  Call the [facility] office at [phone] if you have any questions, problems or concerns.

## 2014-11-11 ENCOUNTER — Ambulatory Visit (INDEPENDENT_AMBULATORY_CARE_PROVIDER_SITE_OTHER)
Admission: RE | Admit: 2014-11-11 | Discharge: 2014-11-11 | Disposition: A | Discharge status: Home / Self Care | Payer: Managed Care, Other (non HMO) | Source: Ambulatory Visit | Attending: Family Medicine | Admitting: Family Medicine

## 2014-11-11 ENCOUNTER — Ambulatory Visit (INDEPENDENT_AMBULATORY_CARE_PROVIDER_SITE_OTHER): Payer: Managed Care, Other (non HMO) | Admitting: Family Medicine

## 2014-11-11 VITALS — BP 120/78 | Temp 98.2°F | Wt 208.0 lb

## 2014-11-11 DIAGNOSIS — S065XAA Traumatic subdural hemorrhage with loss of consciousness status unknown, initial encounter: Secondary | ICD-10-CM

## 2014-11-11 DIAGNOSIS — S065X9A Traumatic subdural hemorrhage with loss of consciousness of unspecified duration, initial encounter: Secondary | ICD-10-CM

## 2014-11-11 DIAGNOSIS — I6200 Nontraumatic subdural hemorrhage, unspecified: Secondary | ICD-10-CM

## 2014-11-11 NOTE — Patient Instructions (Signed)
Restart the aspirin  Continue your exercise program    avoid heights!!!!!!!!!!!!!!   CT scan for follow-up

## 2014-11-11 NOTE — Progress Notes (Signed)
   Subjective:    Patient ID: Jeff Hawkins, male    DOB: May 02, 1956, 59 y.o.   MRN: [id number]  HPI  Jeff Hawkins is a 59 year old married male nonsmoker who comes in today for follow-up having had a subdural hematoma 2 vacuum weight it in [place] January 20 15,016   he fell here in October riding a horse. He went to the emergency room. X-ray scans all were normal. We removed the staples here from his scalp. He felt well until January when he went back to [place] he started having some intermittent tingling in his right arm. Initially would come and go then it became constant. He saw a medical provider in [place] was told to stop the aspirin come back in a week and they would evacuate his subdurals. He had 2 subdurals which were evacuated. He had no complications was discharged after 3 days and has done well.    he would like to get a follow-up CT scan. Neurologically he feels well. No symptoms. Walking 6 miles a day. He would also like to know when to restart his aspirin because of his underlying coronary artery disease   Review of Systems  review of systems otherwise negative    Objective:   Physical Exam   well-developed well-nourished male no acute distress vital signs stable he is afebrile neurologic exam he is oriented 3 gait normal sensation normal strength normal reflexes normal      Assessment & Plan:   status post subdural evacuation 09/22/2014...Marland Kitchen.Marland Kitchen.Marland Kitchen. Restart aspirin... Follow-up CT scan

## 2014-11-11 NOTE — Progress Notes (Signed)
Pre visit review using our clinic review tool, if applicable. No additional management support is needed unless otherwise documented below in the visit note. 

## 2015-01-24 ENCOUNTER — Other Ambulatory Visit: Payer: Self-pay | Admitting: Cardiovascular Disease

## 2015-01-27 ENCOUNTER — Ambulatory Visit (INDEPENDENT_AMBULATORY_CARE_PROVIDER_SITE_OTHER): Payer: Managed Care, Other (non HMO) | Admitting: Family Medicine

## 2015-01-27 VITALS — BP 120/80 | Temp 97.8°F | Wt 207.0 lb

## 2015-01-27 DIAGNOSIS — M5442 Lumbago with sciatica, left side: Secondary | ICD-10-CM | POA: Diagnosis not present

## 2015-01-27 DIAGNOSIS — S065XAA Traumatic subdural hemorrhage with loss of consciousness status unknown, initial encounter: Secondary | ICD-10-CM

## 2015-01-27 DIAGNOSIS — I6200 Nontraumatic subdural hemorrhage, unspecified: Secondary | ICD-10-CM | POA: Diagnosis not present

## 2015-01-27 DIAGNOSIS — S065X9A Traumatic subdural hemorrhage with loss of consciousness of unspecified duration, initial encounter: Secondary | ICD-10-CM

## 2015-01-27 MED ORDER — TRAMADOL HCL 50 MG PO TABS: 50.0000 mg | ORAL_TABLET | Freq: Three times a day (TID) | ORAL | Status: DC | PRN

## 2015-01-27 MED ORDER — PREDNISONE 20 MG PO TABS: ORAL_TABLET | ORAL | Status: DC

## 2015-01-27 NOTE — Progress Notes (Signed)
   Subjective:    Patient ID: Jeff BalsamDaniel L Hawkins, male    DOB: June 24, 1956, 59 y.o.   MRN: [id number]  HPI Jeff Hawkins is a [age] year old married male nonsmoker who comes in today for evaluation of 2 issues  He had a subdural hematoma evacuated in [place] on 09/22/2014. He did well and had no copper occasions. When he came back to [place] he wanted a follow-up CT scan although they didn't think it was necessary in [place]. He was asymptomatic. Follow-up scan said some residual blood but otherwise normal. He wife wants another CT scan because he bumped his head 10 days ago. No loss of consciousness. He backed into a door. He had a small laceration which he treated himself.  He's had chronic back pain and over the last 10 days since he bumped his head is back pains worse. He now describes as fairly constant dull a 4 on a scale of 1-10. It radiates down the inner side of his left thigh to just above the knee and that pain is sharp. It's decreased by walking and increased by sitting. No neurologic symptoms no muscle weakness etc. etc.   Review of Systems    review of systems otherwise negative except he travels through [place] every 3 days he's on some different country Objective:   Physical Exam     well-developed well-nourished male no acute distress vital signs stable he is afebrile in the supine position his legs were of equal length. Sensation muscle strength reflexes all within normal limits except for decrease in the reflux of his left knee compared to the right. Also straight leg processing left about 70    Assessment & Plan:   Status post subdural hematoma ,,,,,,,, clinically is asymptomatic ,,,,, wife requesting a CT scan for follow-up ,,,,,,, neurosurgery  Lumbar disc disease ,,,, with radiation down the inner side of his left thigh ,,,,,,,,, bedrest for 48 hours prednisone tramadol consult with Odette FractionPaul Harkins to consider epidural steroid injections

## 2015-01-27 NOTE — Patient Instructions (Signed)
Bedrest for 48 hours  Prednisone 20 mg........... 2 tabs daily for 3 days then taper as outlined  Tramadol 50 mg........... one twice daily while-year-old bedrest............. then 1/2-1 tablet twice daily when necessary  We will get you set up a consult with Dr. Odette FractionPaul Harkins at the neurosurgery office to discuss epidural steroid injections

## 2015-01-27 NOTE — Progress Notes (Signed)
Pre visit review using our clinic review tool, if applicable. No additional management support is needed unless otherwise documented below in the visit note. 

## 2015-01-28 ENCOUNTER — Ambulatory Visit (INDEPENDENT_AMBULATORY_CARE_PROVIDER_SITE_OTHER)
Admission: RE | Admit: 2015-01-28 | Discharge: 2015-01-28 | Disposition: A | Discharge status: Home / Self Care | Payer: Managed Care, Other (non HMO) | Source: Ambulatory Visit | Attending: Family Medicine | Admitting: Family Medicine

## 2015-01-28 DIAGNOSIS — S065X9A Traumatic subdural hemorrhage with loss of consciousness of unspecified duration, initial encounter: Secondary | ICD-10-CM

## 2015-01-28 DIAGNOSIS — I6200 Nontraumatic subdural hemorrhage, unspecified: Secondary | ICD-10-CM | POA: Diagnosis not present

## 2015-01-28 DIAGNOSIS — S065XAA Traumatic subdural hemorrhage with loss of consciousness status unknown, initial encounter: Secondary | ICD-10-CM

## 2015-01-29 ENCOUNTER — Encounter: Payer: Self-pay | Admitting: Family Medicine

## 2015-01-29 NOTE — Telephone Encounter (Signed)
Pt notified that Dr. Tawanna Coolerodd and Fleet Contrasachel were not in the office today.  Waiting on interpretation of results.

## 2015-02-20 ENCOUNTER — Ambulatory Visit: Payer: Managed Care, Other (non HMO) | Admitting: Cardiovascular Disease

## 2015-03-26 ENCOUNTER — Ambulatory Visit (INDEPENDENT_AMBULATORY_CARE_PROVIDER_SITE_OTHER): Payer: Managed Care, Other (non HMO) | Admitting: Internal Medicine

## 2015-03-26 ENCOUNTER — Ambulatory Visit (INDEPENDENT_AMBULATORY_CARE_PROVIDER_SITE_OTHER): Payer: Managed Care, Other (non HMO) | Admitting: Cardiovascular Disease

## 2015-03-26 ENCOUNTER — Encounter: Payer: Self-pay | Admitting: Internal Medicine

## 2015-03-26 ENCOUNTER — Encounter: Payer: Self-pay | Admitting: Cardiovascular Disease

## 2015-03-26 VITALS — BP 120/70 | HR 74 | Temp 98.1°F | Resp 20 | Ht 73.0 in | Wt 206.0 lb

## 2015-03-26 VITALS — BP 126/80 | HR 73 | Ht 73.0 in | Wt 202.8 lb

## 2015-03-26 DIAGNOSIS — E785 Hyperlipidemia, unspecified: Secondary | ICD-10-CM

## 2015-03-26 DIAGNOSIS — I2510 Atherosclerotic heart disease of native coronary artery without angina pectoris: Secondary | ICD-10-CM | POA: Diagnosis not present

## 2015-03-26 DIAGNOSIS — I10 Essential (primary) hypertension: Secondary | ICD-10-CM

## 2015-03-26 DIAGNOSIS — S0990XA Unspecified injury of head, initial encounter: Secondary | ICD-10-CM

## 2015-03-26 DIAGNOSIS — I6200 Nontraumatic subdural hemorrhage, unspecified: Secondary | ICD-10-CM | POA: Diagnosis not present

## 2015-03-26 DIAGNOSIS — S065XAA Traumatic subdural hemorrhage with loss of consciousness status unknown, initial encounter: Secondary | ICD-10-CM

## 2015-03-26 DIAGNOSIS — S065X9A Traumatic subdural hemorrhage with loss of consciousness of unspecified duration, initial encounter: Secondary | ICD-10-CM

## 2015-03-26 MED ORDER — ASPIRIN EC 81 MG PO TBEC: 81.0000 mg | DELAYED_RELEASE_TABLET | Freq: Every day | ORAL | Status: AC

## 2015-03-26 NOTE — Patient Instructions (Signed)
Head CT scan as discussed Report any new or worsening symptoms  

## 2015-03-26 NOTE — Progress Notes (Signed)
No chief complaint on file.    History of Present Illness: 59 yo WM with a history of CAD, status post drug-eluting stent to the mid LAD in February 2011, HTN, hyperlipidemia here today for routine cardiac follow up. He was readmitted fall of 2011 with CP. He had elevated CK-MBs but his troponins were negative. Cardiac catheterization demonstrated a patent stent in the LAD. He had a septal perforator coming off of the stent with a stable 80% stenosis and an 80% stenosis in a small ramus intermediate. Medical therapy was pursued. He moved to [place] in September 2014. Stress myoview February 2015 with no ischemia. 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 Aspirin stopped. Seen back with repeat CT one week later and was advised to be admitted for surgery and had left burr hold drainage and subdural drain placement on September 22, 2014. He was seen in our office March 2015 by Norma Fredrickson, NP and was doing well.   He is here today for follow up. No chest pain or SOB. No syncope, near syncope, orthopnea or PND. Recent CT head with no evidence of IC hemorrhage. Feeling great and exercising every day.   Primary Care Physician: Dr. Tawanna Cooler  Last Lipid Profile:Lipid Panel     Component Value Date/Time   CHOL 151 07/21/2014 1123   TRIG 95.0 07/21/2014 1123   HDL 60.00 07/21/2014 1123   CHOLHDL 3 07/21/2014 1123   VLDL 19.0 07/21/2014 1123   LDLCALC 72 07/21/2014 1123     Past Medical History  Diagnosis Date  . Coronary artery disease 10/22/09     99% proximal to mid LAD, s/p DES x 1.  . CAD (coronary artery disease) 07/2010    septal perf 80%; RI (small) 80%; LAD stent ok; med tx  . Hyperlipidemia   . Hypertension   . Diverticulosis of colon     Past Surgical History  Procedure Laterality Date  . Ear implant-stapes replacement    . Ear surgeries  R 1986, L 2003  . Right knee meniscus removal    . Left knee surgery  remotely      Current Outpatient Prescriptions  Medication Sig Dispense Refill  . hydrochlorothiazide (HYDRODIURIL) 25 MG tablet Take 25 mg by mouth daily.    Marland Kitchen lisinopril (PRINIVIL,ZESTRIL) 20 MG tablet Take 1 tablet (20 mg total) by mouth daily. 100 tablet 3  . nitroGLYCERIN (NITROSTAT) 0.4 MG SL tablet Place 0.4 mg under the tongue every 5 (five) minutes as needed for chest pain.    . rosuvastatin (CRESTOR) 20 MG tablet Take 1 tablet (20 mg total) by mouth daily. 100 tablet 3  . sertraline (ZOLOFT) 50 MG tablet Take 50 mg by mouth daily.    . traMADol (ULTRAM) 50 MG tablet Take 1 tablet (50 mg total) by mouth every 8 (eight) hours as needed. 60 tablet 2  . aspirin EC 81 MG tablet Take 1 tablet (81 mg total) by mouth daily. 90 tablet 3  . zolpidem (AMBIEN) 10 MG tablet TK 1 T PO QD HS  5   No current facility-administered medications for this visit.    No Known Allergies  History   Social History  . Marital Status: Married    Spouse Name: N/A  . Number of Children: N/A  . Years of Education: N/A   Occupational History  . Not on file.   Social History Main Topics  . Smoking  status: Former Smoker    Quit date: 08/29/2001  . Smokeless tobacco: Never Used  . Alcohol Use: 0.5 oz/week    1 Standard drinks or equivalent per week     Comment: 1-2 drinks per day  . Drug Use: No  . Sexual Activity: Not on file   Other Topics Concern  . Not on file   Social History Narrative   Regular exercise    Family History  Problem Relation Age of Onset  . Coronary artery disease Father   . Heart disease Father   . Coronary artery disease Brother     stent  . Coronary artery disease Other     1st degree male relative  . Hypertension Other   . Diabetes Other     1st degree relative     Review of Systems:  As stated in the HPI and otherwise negative.   BP 126/80 mmHg  Pulse 73  Ht  (1.854 m)  Wt 202 lb 12 oz (91.967 kg)  BMI 26.76 kg/m2  SpO2 96%  Physical  Examination: General: Well developed, well nourished, NAD HEENT: OP clear, mucus membranes moist SKIN: warm, dry. No rashes. Neuro: No focal deficits Musculoskeletal: Muscle strength 5/5 all ext Psychiatric: Mood and affect normal Neck: No JVD, no carotid bruits, no thyromegaly, no lymphadenopathy. Lungs:Clear bilaterally, no wheezes, rhonci, crackles Cardiovascular: Regular rate and rhythm. No murmurs, gallops or rubs. Abdomen:Soft. Bowel sounds present. Non-tender.  Extremities: No lower extremity edema. Pulses are 2 + in the bilateral DP/PT.  EKG:  EKG is not ordered today. The ekg ordered today demonstrates   Recent Labs: 07/21/2014: ALT 27; BUN 13; Creatinine, Ser 1.1; Hemoglobin 13.7; Platelets 335.0; Potassium 4.7; Sodium 141; TSH 1.96   Lipid Panel    Component Value Date/Time   CHOL 151 07/21/2014 1123   TRIG 95.0 07/21/2014 1123   HDL 60.00 07/21/2014 1123   CHOLHDL 3 07/21/2014 1123   VLDL 19.0 07/21/2014 1123   LDLCALC 72 07/21/2014 1123   LDLDIRECT 153.3 07/09/2010 0919     Wt Readings from Last 3 Encounters:  03/26/15 202 lb 12 oz (91.967 kg)  01/27/15 207 lb (93.895 kg)  11/11/14 208 lb (94.348 kg)     Other studies Reviewed: Additional studies/ records that were reviewed today include: . Review of the above records demonstrates:    Assessment and Plan:   1. CAD: He has a mid LAD stent.  He has been off of ASA and Plavix due to subdural bleeding following trauma. No beta blocker due to bradycardia. Will restart ASA 81 mg daily in 2 weeks.   2. Hyperlipidemia: Continue statin. Lipids well controlled.   3. HTN: BP controlled. No changes.    Current medicines are reviewed at length with the patient today.  The patient does not have concerns regarding medicines.  The following changes have been made:  no change  Labs/ tests ordered today include:  No orders of the defined types were placed in this encounter.    Disposition:   FU with me in 12   months  Signed, Verne Carrow, MD 03/26/2015 1:38 PM    [facility] [address] [place], [place], [place]  [address] Phone: [phone]; Fax: [phone]

## 2015-03-26 NOTE — Patient Instructions (Signed)
Medication Instructions:   Your physician recommends that you continue on your current medications as directed. Please refer to the Current Medication list given to you today.  Start aspirin 81 mg back in a few weeks   Labwork: none  Testing/Procedures: none  Follow-Up: Your physician wants you to follow-up in: 12 months.  You will receive a reminder letter in the mail two months in advance. If you don't receive a letter, please call our office to schedule the follow-up appointment.

## 2015-03-26 NOTE — Progress Notes (Signed)
Pre visit review using our clinic review tool, if applicable. No additional management support is needed unless otherwise documented below in the visit note. 

## 2015-03-26 NOTE — Progress Notes (Signed)
Subjective:    Patient ID: Jeff Hawkins, male    DOB: Jun 25, 1956, 59 y.o.   MRN: [id number]  HPI  59 year old patient who has treated hypertension and coronary artery disease.  He has had a prior stent and has been off aspirin therapy since treatment for a subdural hematoma in January of this year. He sustained head trauma in October of last year.  Initial head CT was negative.  He later developed right sided paresthesias and noted have a large left-sided subdural hematoma that required evacuation.  The patient was working in [place] and slipped and fell sustaining right frontal facial trauma about 1 week ago.  This required suturing.  There is no loss of consciousness.  The patient has had some mild neck pain and headaches, but no focal neurological deficits.  He has been mentally clear.  He will be leaving for [place] again soon and is concerned about recurrent subdural.  Past Medical History  Diagnosis Date  . Coronary artery disease 10/22/09     99% proximal to mid LAD, s/p DES x 1.  . CAD (coronary artery disease) 07/2010    septal perf 80%; RI (small) 80%; LAD stent ok; med tx  . Hyperlipidemia   . Hypertension   . Diverticulosis of colon     History   Social History  . Marital Status: Married    Spouse Name: N/A  . Number of Children: N/A  . Years of Education: N/A   Occupational History  . Not on file.   Social History Main Topics  . Smoking status: Former Smoker    Quit date: 08/29/2001  . Smokeless tobacco: Never Used  . Alcohol Use: 0.5 oz/week    1 Standard drinks or equivalent per week     Comment: 1-2 drinks per day  . Drug Use: No  . Sexual Activity: Not on file   Other Topics Concern  . Not on file   Social History Narrative   Regular exercise    Past Surgical History  Procedure Laterality Date  . Ear implant-stapes replacement    . Ear surgeries  R 1986, L 2003  . Right knee meniscus removal    . Left knee surgery remotely      Family  History  Problem Relation Age of Onset  . Coronary artery disease Father   . Heart disease Father   . Coronary artery disease Brother     stent  . Coronary artery disease Other     1st degree male relative  . Hypertension Other   . Diabetes Other     1st degree relative     No Known Allergies  Current Outpatient Prescriptions on File Prior to Visit  Medication Sig Dispense Refill  . aspirin EC 81 MG tablet Take 1 tablet (81 mg total) by mouth daily. 90 tablet 3  . hydrochlorothiazide (HYDRODIURIL) 25 MG tablet Take 25 mg by mouth daily.    Marland Kitchen lisinopril (PRINIVIL,ZESTRIL) 20 MG tablet Take 1 tablet (20 mg total) by mouth daily. 100 tablet 3  . nitroGLYCERIN (NITROSTAT) 0.4 MG SL tablet Place 0.4 mg under the tongue every 5 (five) minutes as needed for chest pain.    . rosuvastatin (CRESTOR) 20 MG tablet Take 1 tablet (20 mg total) by mouth daily. 100 tablet 3  . sertraline (ZOLOFT) 50 MG tablet Take 50 mg by mouth daily.    . traMADol (ULTRAM) 50 MG tablet Take 1 tablet (50 mg total) by mouth every 8 (eight)  hours as needed. 60 tablet 2  . zolpidem (AMBIEN) 10 MG tablet TK 1 T PO QD HS  5   No current facility-administered medications on file prior to visit.    BP 120/70 mmHg  Pulse 74  Temp(Src) 98.1 F (36.7 C) (Oral)  Resp 20  Ht  (1.854 m)  Wt 206 lb (93.441 kg)  BMI 27.18 kg/m2  SpO2 98%     Review of Systems  Constitutional: Negative for fever, chills, appetite change and fatigue.  HENT: Negative for congestion, dental problem, ear pain, hearing loss, sore throat, tinnitus, trouble swallowing and voice change.   Eyes: Negative for pain, discharge and visual disturbance.  Respiratory: Negative for cough, chest tightness, wheezing and stridor.   Cardiovascular: Negative for chest pain, palpitations and leg swelling.  Gastrointestinal: Negative for nausea, vomiting, abdominal pain, diarrhea, constipation, blood in stool and abdominal distention.  Genitourinary:  Negative for urgency, hematuria, flank pain, discharge, difficulty urinating and genital sores.  Musculoskeletal: Positive for neck stiffness. Negative for myalgias, back pain, joint swelling, arthralgias and gait problem.  Skin: Positive for wound. Negative for rash.  Neurological: Positive for headaches. Negative for dizziness, syncope, speech difficulty, weakness and numbness.  Hematological: Negative for adenopathy. Does not bruise/bleed easily.  Psychiatric/Behavioral: Negative for behavioral problems and dysphoric mood. The patient is not nervous/anxious.        Objective:   Physical Exam  Constitutional: He is oriented to person, place, and time. He appears well-developed.  HENT:  Head: Normocephalic.  Right Ear: External ear normal.  Left Ear: External ear normal.  Eyes: Conjunctivae and EOM are normal.  Neck: Normal range of motion.  Cardiovascular: Normal rate and normal heart sounds.   Pulmonary/Chest: Breath sounds normal.  Abdominal: Bowel sounds are normal.  Musculoskeletal: Normal range of motion. He exhibits no edema or tenderness.  Neurological: He is alert and oriented to person, place, and time. He has normal reflexes. No cranial nerve deficit. Coordination normal.  Able to do a tandem walk  Skin:  Healing laceration over the left eyebrow  Psychiatric: He has a normal mood and affect. His behavior is normal.          Assessment & Plan:   Status post head trauma one week ago History of left-sided subdural hematoma Normal neurological examination  Options discussed.  Will repeat head CT to rule out recurrent subdural prior to leaving the country He will report any new or worsening symptoms

## 2015-03-27 ENCOUNTER — Inpatient Hospital Stay: Admission: RE | Admit: 2015-03-27 | Payer: Managed Care, Other (non HMO) | Source: Ambulatory Visit

## 2015-03-30 ENCOUNTER — Other Ambulatory Visit: Payer: Self-pay | Admitting: Family Medicine

## 2015-04-01 ENCOUNTER — Ambulatory Visit (INDEPENDENT_AMBULATORY_CARE_PROVIDER_SITE_OTHER)
Admission: RE | Admit: 2015-04-01 | Discharge: 2015-04-01 | Disposition: A | Discharge status: Home / Self Care | Payer: Managed Care, Other (non HMO) | Source: Ambulatory Visit | Attending: Internal Medicine | Admitting: Internal Medicine

## 2015-04-01 DIAGNOSIS — S0990XA Unspecified injury of head, initial encounter: Secondary | ICD-10-CM

## 2015-04-02 ENCOUNTER — Telehealth: Payer: Self-pay | Admitting: Family Medicine

## 2015-04-02 NOTE — Telephone Encounter (Signed)
Patient informed. 

## 2015-04-02 NOTE — Telephone Encounter (Signed)
Pt called to see if results of CT scan was available.

## 2015-04-03 ENCOUNTER — Other Ambulatory Visit: Payer: Self-pay | Admitting: Cardiovascular Disease

## 2015-06-04 ENCOUNTER — Other Ambulatory Visit: Payer: Self-pay | Admitting: Family Medicine

## 2015-07-03 ENCOUNTER — Other Ambulatory Visit: Payer: Self-pay | Admitting: Family Medicine

## 2015-08-28 ENCOUNTER — Other Ambulatory Visit: Payer: Self-pay | Admitting: Family Medicine

## 2015-08-31 ENCOUNTER — Other Ambulatory Visit: Payer: Self-pay | Admitting: Family Medicine

## 2015-09-01 ENCOUNTER — Other Ambulatory Visit: Payer: Self-pay | Admitting: Family Medicine

## 2015-09-01 MED ORDER — HYDROCHLOROTHIAZIDE 25 MG PO TABS: 25.0000 mg | ORAL_TABLET | Freq: Every day | ORAL | Status: DC

## 2015-10-10 ENCOUNTER — Other Ambulatory Visit: Payer: Self-pay | Admitting: Family Medicine

## 2015-12-24 ENCOUNTER — Other Ambulatory Visit: Payer: Self-pay | Admitting: Family Medicine

## 2016-02-16 ENCOUNTER — Other Ambulatory Visit: Payer: Self-pay | Admitting: Family Medicine

## 2016-03-26 ENCOUNTER — Other Ambulatory Visit: Payer: Self-pay | Admitting: Cardiovascular Disease

## 2016-03-31 ENCOUNTER — Other Ambulatory Visit: Payer: Self-pay

## 2016-03-31 MED ORDER — SERTRALINE HCL 50 MG PO TABS: 50.0000 mg | ORAL_TABLET | Freq: Every day | ORAL | 0 refills | Status: DC

## 2016-04-06 ENCOUNTER — Other Ambulatory Visit: Payer: Self-pay | Admitting: Family Medicine

## 2016-04-06 MED ORDER — ROSUVASTATIN CALCIUM 20 MG PO TABS: 20.0000 mg | ORAL_TABLET | Freq: Every day | ORAL | 5 refills | Status: DC

## 2016-04-13 ENCOUNTER — Other Ambulatory Visit: Payer: Self-pay | Admitting: Family Medicine

## 2016-04-14 ENCOUNTER — Other Ambulatory Visit: Payer: Self-pay | Admitting: Family Medicine

## 2016-04-15 ENCOUNTER — Encounter: Payer: Self-pay | Admitting: Internal Medicine

## 2016-04-15 ENCOUNTER — Ambulatory Visit (INDEPENDENT_AMBULATORY_CARE_PROVIDER_SITE_OTHER): Payer: Managed Care, Other (non HMO) | Admitting: Internal Medicine

## 2016-04-15 VITALS — BP 130/90 | HR 57 | Temp 98.1°F | Ht 73.0 in | Wt 206.0 lb

## 2016-04-15 DIAGNOSIS — I10 Essential (primary) hypertension: Secondary | ICD-10-CM

## 2016-04-15 DIAGNOSIS — L570 Actinic keratosis: Secondary | ICD-10-CM

## 2016-04-15 MED ORDER — HYOSCYAMINE SULFATE 0.125 MG SL SUBL: 0.1250 mg | SUBLINGUAL_TABLET | SUBLINGUAL | 3 refills | Status: DC | PRN

## 2016-04-15 MED ORDER — LISINOPRIL 20 MG PO TABS: 20.0000 mg | ORAL_TABLET | Freq: Every day | ORAL | 3 refills | Status: DC

## 2016-04-15 NOTE — Progress Notes (Signed)
Subjective:    Patient ID: Jeff BalsamDaniel L Hawkins, male    DOB: Mar 26, 1956, 60 y.o.   MRN: [id number]  HPI 60 year old patient who has essential hypertension.  He has been out of lisinopril for some time.  Generally blood pressure runs in a normal range He is seen today with concerns of skin lesions  Is requesting refills on multiple medications  Past Medical History:  Diagnosis Date  . CAD (coronary artery disease) 07/2010   septal perf 80%; RI (small) 80%; LAD stent ok; med tx  . Coronary artery disease 10/22/09    99% proximal to mid LAD, s/p DES x 1.  . Diverticulosis of colon   . Hyperlipidemia   . Hypertension      Social History   Social History  . Marital status: Married    Spouse name: N/A  . Number of children: N/A  . Years of education: N/A   Occupational History  . Not on file.   Social History Main Topics  . Smoking status: Former Smoker    Quit date: 08/29/2001  . Smokeless tobacco: Never Used  . Alcohol use 0.5 oz/week    1 Standard drinks or equivalent per week     Comment: 1-2 drinks per day  . Drug use: No  . Sexual activity: Not on file   Other Topics Concern  . Not on file   Social History Narrative   Regular exercise    Past Surgical History:  Procedure Laterality Date  . Ear implant-stapes replacement    . Ear surgeries  R 1986, L 2003  . left knee surgery remotely    . Right knee meniscus removal      Family History  Problem Relation Age of Onset  . Coronary artery disease Father   . Heart disease Father   . Coronary artery disease Brother     stent  . Coronary artery disease Other     1st degree male relative  . Hypertension Other   . Diabetes Other     1st degree relative     No Known Allergies  Current Outpatient Prescriptions on File Prior to Visit  Medication Sig Dispense Refill  . aspirin EC 81 MG tablet Take 1 tablet (81 mg total) by mouth daily. 90 tablet 3  . hydrochlorothiazide (HYDRODIURIL) 25 MG tablet Take 1 tablet  (25 mg total) by mouth daily. 90 tablet 0  . nitroGLYCERIN (NITROSTAT) 0.4 MG SL tablet Place 0.4 mg under the tongue every 5 (five) minutes as needed for chest pain.    . rosuvastatin (CRESTOR) 20 MG tablet Take 1 tablet (20 mg total) by mouth daily. 100 tablet 5  . sertraline (ZOLOFT) 50 MG tablet Take 1 tablet (50 mg total) by mouth daily. 90 tablet 0  . traMADol (ULTRAM) 50 MG tablet TAKE 1 TABLET BY MOUTH EVERY 8 HOURS AS NEEDED 60 tablet 0  . zolpidem (AMBIEN) 10 MG tablet TAKE 1 TABLET BY MOUTH EVERY DAY AT BEDTIME 30 tablet 0   No current facility-administered medications on file prior to visit.     BP 130/90 (BP Location: Left Arm, Patient Position: Sitting, Cuff Size: Normal)   Pulse (!) 57   Temp 98.1 F (36.7 C) (Oral)   Ht 6\' 1"  (1.854 m)   Wt 206 lb (93.4 kg)   SpO2 99%   BMI 27.18 kg/m      Review of Systems  Constitutional: Negative for appetite change, chills, fatigue and fever.  HENT: Negative for  congestion, dental problem, ear pain, hearing loss, sore throat, tinnitus, trouble swallowing and voice change.   Eyes: Negative for pain, discharge and visual disturbance.  Respiratory: Negative for cough, chest tightness, wheezing and stridor.   Cardiovascular: Negative for chest pain, palpitations and leg swelling.  Gastrointestinal: Negative for abdominal distention, abdominal pain, blood in stool, constipation, diarrhea, nausea and vomiting.  Genitourinary: Negative for difficulty urinating, discharge, flank pain, genital sores, hematuria and urgency.  Musculoskeletal: Negative for arthralgias, back pain, gait problem, joint swelling, myalgias and neck stiffness.  Skin: Positive for rash.  Neurological: Negative for dizziness, syncope, speech difficulty, weakness, numbness and headaches.  Hematological: Negative for adenopathy. Does not bruise/bleed easily.  Psychiatric/Behavioral: Negative for behavioral problems and dysphoric mood. The patient is not  nervous/anxious.        Objective:   Physical Exam  Constitutional: He is oriented to person, place, and time. He appears well-developed.  Blood pressure 140/90  HENT:  Head: Normocephalic.  Right Ear: External ear normal.  Left Ear: External ear normal.  Eyes: Conjunctivae and EOM are normal.  Neck: Normal range of motion.  Cardiovascular: Normal rate and normal heart sounds.   Pulmonary/Chest: Breath sounds normal.  Abdominal: Bowel sounds are normal.  Musculoskeletal: Normal range of motion. He exhibits no edema or tenderness.  Neurological: He is alert and oriented to person, place, and time.  Skin:  Scattered actinic keratoses, primarily over the dorsal aspects of the hands Papular lesion, nonspecific in the right clavicular area  Pigmented lesion mid back  Psychiatric: He has a normal mood and affect. His behavior is normal.          Assessment & Plan:   Hypertension.  Lisinopril refilled.  Schedule CPX with PCP Multiple skin lesions including AK's.  Skin cancer problem complexion.  Will set up for dermatology visit for full body exam.  Does have a bothersome pigmented lesion in the mid back area Dyslipidemia  CPX scheduled Dermatology referral scheduled  Rogelia BogaKWIATKOWSKI,PETER FRANK, MD

## 2016-04-15 NOTE — Patient Instructions (Signed)
Limit your sodium (Salt) intake  Please check your blood pressure on a regular basis.  If it is consistently greater than 150/90, please make an office appointment.  Dermatology follow-up as discussed

## 2016-06-08 IMAGING — CR DG LUMBAR SPINE COMPLETE 4+V
5 series · 5 of 5 positions shown · non-contrast
Comparison: None.

CLINICAL DATA: Left-sided back pain radiating into left lower
extremity.

EXAM:
LUMBAR SPINE - COMPLETE 4+ VIEW

[view not recorded (1 of 5)]
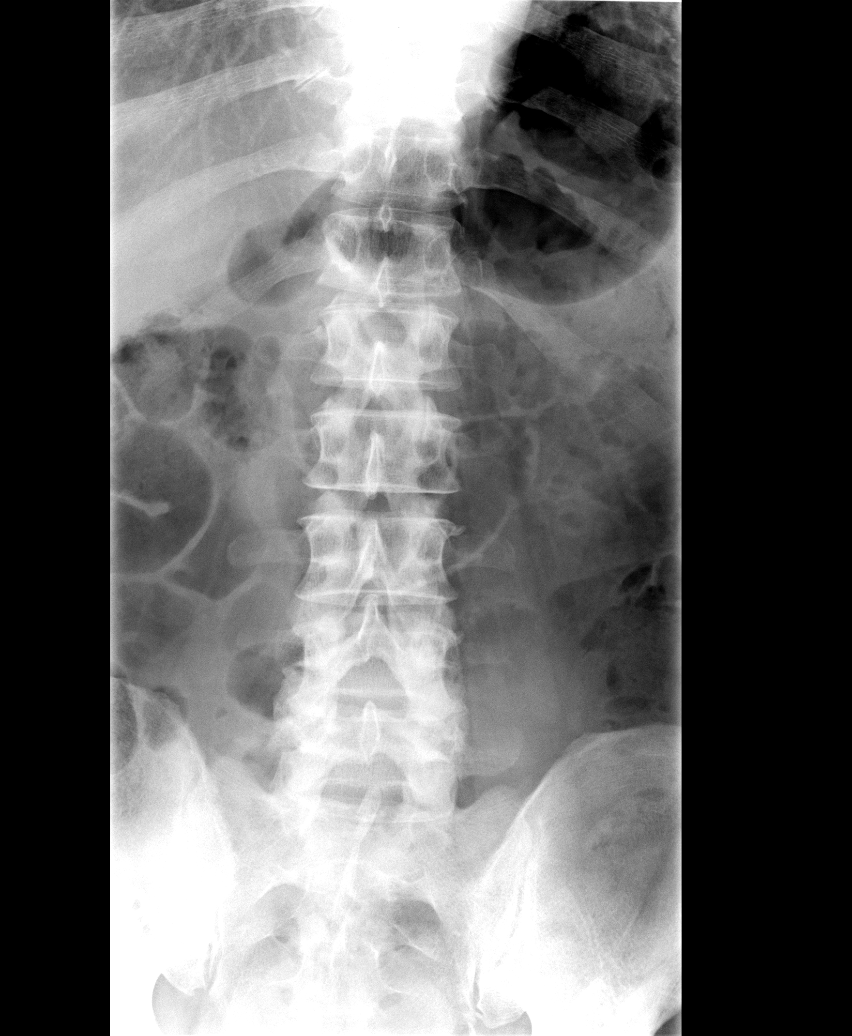

[view not recorded (2 of 5)]
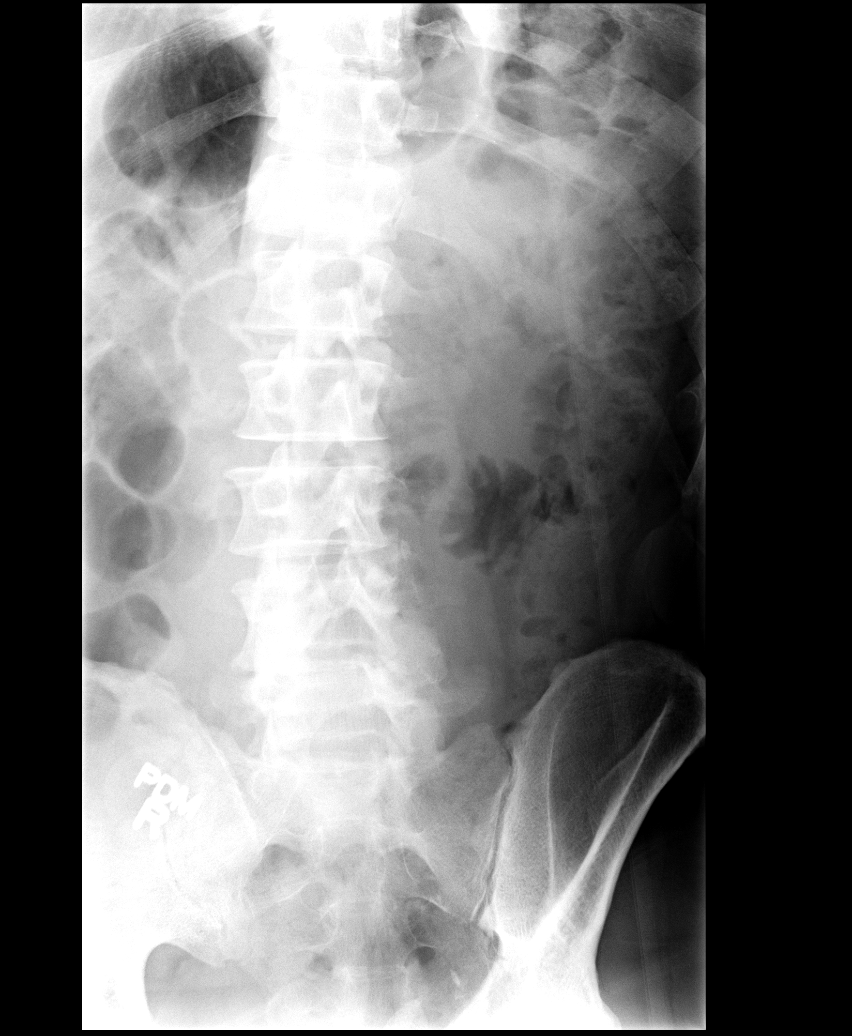

[view not recorded (3 of 5)]
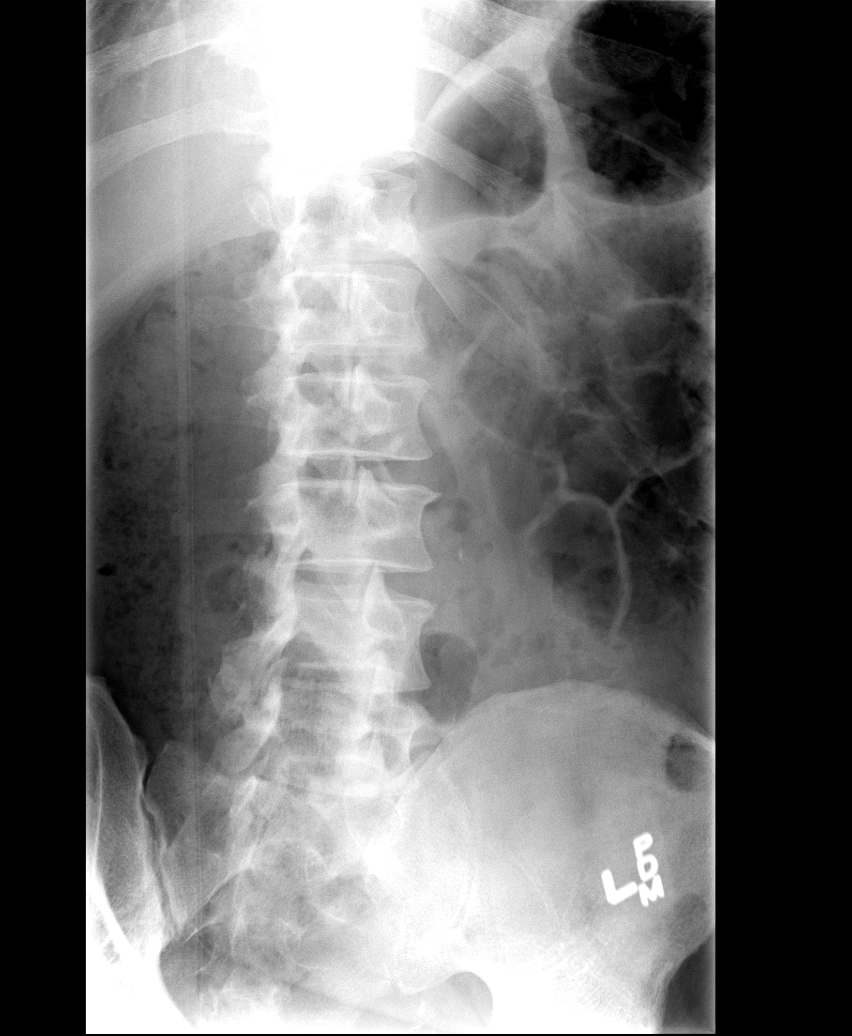

[view not recorded (4 of 5)]
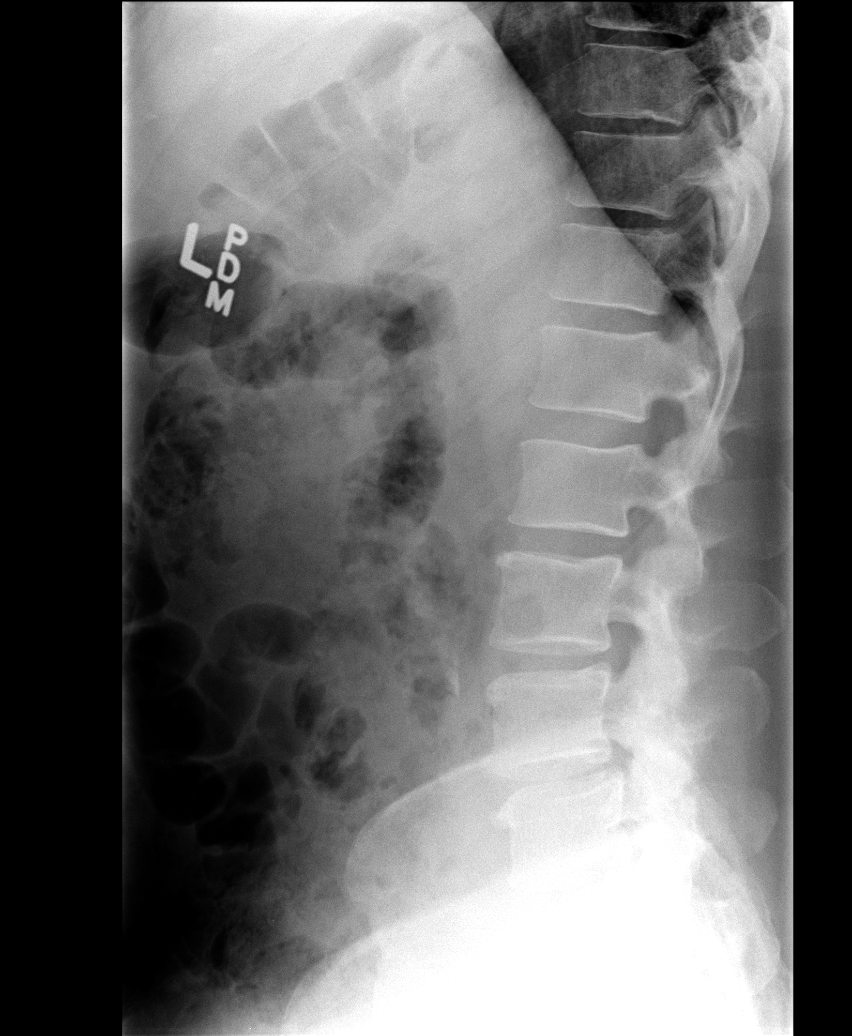

[view not recorded (5 of 5)]
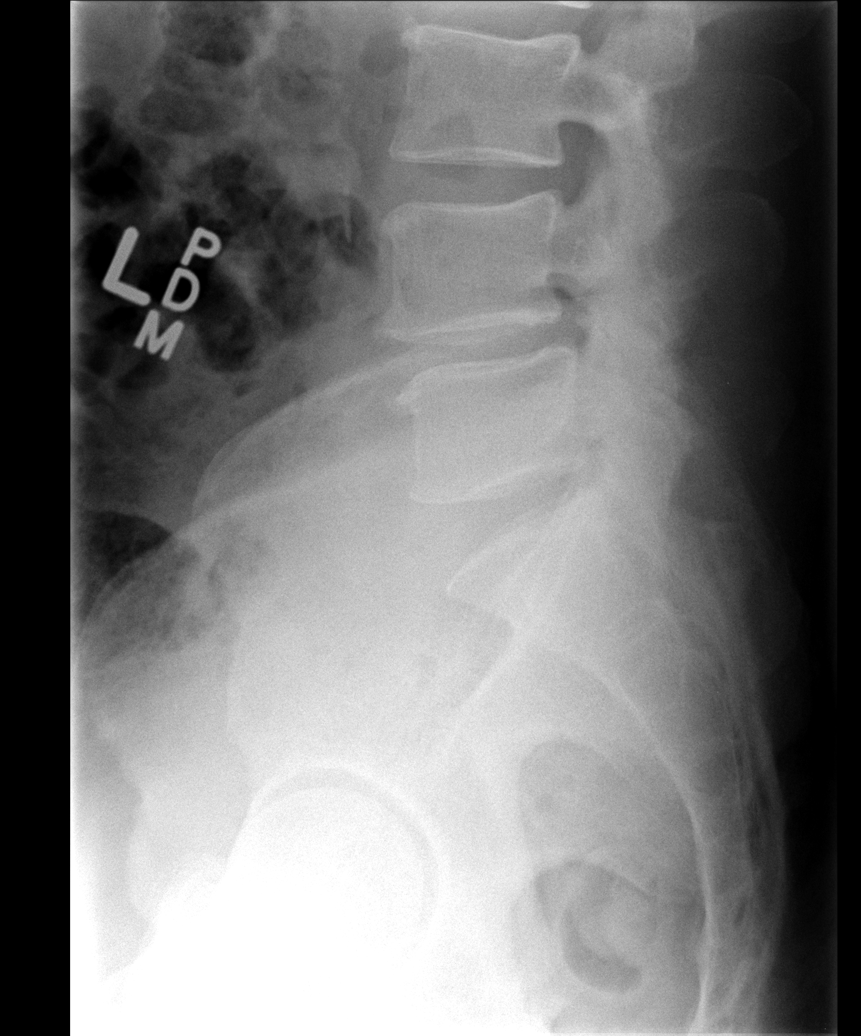

[5 of 5 positions shown; findings below may reference images not displayed]

FINDINGS: No fracture or subluxation. Moderate facet hypertrophy present at
L3-4, L4-5 and L5-S1 without significant disc space narrowing. There
is suggestion of mild disc space narrowing at L4-5. No bony lesions
or destruction identified.
IMPRESSION: Lumbar spondylosis consisting primarily of facet hypertrophy at
L3-4, L4-5 and L5-S1. Suggestion of mild disc space narrowing at
L4-5.

## 2016-06-18 ENCOUNTER — Other Ambulatory Visit: Payer: Self-pay | Admitting: Internal Medicine

## 2016-06-21 ENCOUNTER — Other Ambulatory Visit: Payer: Self-pay | Admitting: Emergency Medicine

## 2016-06-23 ENCOUNTER — Other Ambulatory Visit: Payer: Self-pay | Admitting: *Deleted

## 2016-06-23 MED ORDER — ZOLPIDEM TARTRATE 10 MG PO TABS: 10.0000 mg | ORAL_TABLET | Freq: Every day | ORAL | 5 refills | Status: DC

## 2016-07-18 ENCOUNTER — Other Ambulatory Visit: Payer: Self-pay | Admitting: Family Medicine

## 2016-08-20 ENCOUNTER — Other Ambulatory Visit: Payer: Self-pay | Admitting: Internal Medicine

## 2016-08-23 NOTE — Telephone Encounter (Signed)
Called pt to clarify that [place]  Awaiting call back from pt.

## 2016-08-24 NOTE — Telephone Encounter (Signed)
Jeff Hawkins pt would like to have his Rx [facility] [place]  [phone].

## 2016-10-16 ENCOUNTER — Other Ambulatory Visit: Payer: Self-pay | Admitting: Family Medicine

## 2017-01-04 ENCOUNTER — Other Ambulatory Visit: Payer: Self-pay | Admitting: Family Medicine

## 2017-01-04 ENCOUNTER — Other Ambulatory Visit: Payer: Self-pay | Admitting: Internal Medicine

## 2017-01-05 ENCOUNTER — Telehealth: Payer: Self-pay | Admitting: Family Medicine

## 2017-01-05 NOTE — Telephone Encounter (Signed)
Dr. Kirtland BouchardK seen pt 03/2016 for HTN.  No labs since 2015.  Will get authorization to fill for 90 days and get pt in for CPX.

## 2017-01-05 NOTE — Telephone Encounter (Signed)
Sent in for 90 days as authorized.  Message sent to scheduling to help get the pt on the schedule.

## 2017-01-05 NOTE — Telephone Encounter (Signed)
Pt needs CPX.  Please help him to get on the schedule.  Should come fasting for lab work.  Thanks!!

## 2017-01-05 NOTE — Telephone Encounter (Signed)
Agree.  Needs some follow up labs.

## 2017-01-06 NOTE — Telephone Encounter (Signed)
lmom for pt to call back

## 2017-01-06 NOTE — Telephone Encounter (Signed)
Patients last OV was 04/15/16 and labs were on 07/21/14.Please Advise

## 2017-01-10 NOTE — Telephone Encounter (Signed)
Called in Tramadol and Ambien.  Left on machine.

## 2017-01-11 NOTE — Telephone Encounter (Signed)
lmom for pt to call back

## 2017-01-16 NOTE — Telephone Encounter (Signed)
Pt has sch an appt with cory

## 2017-01-17 ENCOUNTER — Telehealth: Payer: Self-pay | Admitting: Family Medicine

## 2017-01-17 NOTE — Telephone Encounter (Signed)
Pt returned the call to the office and not sure it was or what the call was in reference to [place] in [place] and travels.  Pt also was checking to make sure that his medication was going to [place] and not the [facility], pt is aware that the medication did go to the [place] location.

## 2017-01-17 NOTE — Telephone Encounter (Signed)
I have not contacted the pt.

## 2017-02-01 ENCOUNTER — Ambulatory Visit (INDEPENDENT_AMBULATORY_CARE_PROVIDER_SITE_OTHER): Payer: Managed Care, Other (non HMO) | Admitting: Adult Health

## 2017-02-01 ENCOUNTER — Encounter: Payer: Self-pay | Admitting: Adult Health

## 2017-02-01 VITALS — BP 120/80 | HR 60 | Temp 98.3°F | Wt 208.9 lb

## 2017-02-01 DIAGNOSIS — J40 Bronchitis, not specified as acute or chronic: Secondary | ICD-10-CM

## 2017-02-01 DIAGNOSIS — Z760 Encounter for issue of repeat prescription: Secondary | ICD-10-CM | POA: Diagnosis not present

## 2017-02-01 DIAGNOSIS — L509 Urticaria, unspecified: Secondary | ICD-10-CM

## 2017-02-01 MED ORDER — NITROGLYCERIN 0.4 MG SL SUBL: 0.4000 mg | SUBLINGUAL_TABLET | SUBLINGUAL | 6 refills | Status: AC | PRN

## 2017-02-01 MED ORDER — LISINOPRIL 20 MG PO TABS: 20.0000 mg | ORAL_TABLET | Freq: Every day | ORAL | 3 refills | Status: AC

## 2017-02-01 MED ORDER — PREDNISONE 10 MG PO TABS: ORAL_TABLET | ORAL | 0 refills | Status: AC

## 2017-02-01 MED ORDER — ROSUVASTATIN CALCIUM 20 MG PO TABS: 20.0000 mg | ORAL_TABLET | Freq: Every day | ORAL | 5 refills | Status: AC

## 2017-02-01 MED ORDER — HYDROCHLOROTHIAZIDE 25 MG PO TABS: 25.0000 mg | ORAL_TABLET | Freq: Every day | ORAL | 3 refills | Status: AC

## 2017-02-01 MED ORDER — SERTRALINE HCL 50 MG PO TABS: ORAL_TABLET | ORAL | 1 refills | Status: DC

## 2017-02-01 MED ORDER — HYOSCYAMINE SULFATE 0.125 MG SL SUBL: 0.1250 mg | SUBLINGUAL_TABLET | SUBLINGUAL | 3 refills | Status: AC | PRN

## 2017-02-01 NOTE — Progress Notes (Signed)
Subjective:    Patient ID: Jeff Hawkins, male    DOB: Dec 03, 1955, 61 y.o.   MRN: [id number]  HPI  61 year old male who  has a past medical history of CAD (coronary artery disease) (07/2010); Coronary artery disease (10/22/09); Diverticulosis of colon; Hyperlipidemia; and Hypertension. He is a patient of Dr. Tawanna Cooler who presents to the office today with multiple acute complaints.   1. He reports that he has had a dry cough and chest congestion that becomes worse at night. He has had this cough for 10 days. Denies any fevers or feeling acutely ill. No sinus pain and pressure. He has been using Nyquil without any relief.   2. [place] in [place] developed a rash on his arms, in his axilla, and on his legs. He was given a steroid cream and 3 days of prednisone. This resolved the issue. Over the course of the week he has redeveloped that rash in his axilla. He has changed deodorant multiple times to see if this has helped, and has switched detergents multiple times. Despite these changes he continues to have a red itchy rash in bilateral axilla.   3. He needs his medications refilled.    Review of Systems See HPI   Past Medical History:  Diagnosis Date  . CAD (coronary artery disease) 07/2010   septal perf 80%; RI (small) 80%; LAD stent ok; med tx  . Coronary artery disease 10/22/09    99% proximal to mid LAD, s/p DES x 1.  . Diverticulosis of colon   . Hyperlipidemia   . Hypertension     Social History   Social History  . Marital status: Married    Spouse name: N/A  . Number of children: N/A  . Years of education: N/A   Occupational History  . Not on file.   Social History Main Topics  . Smoking status: Former Smoker    Quit date: 08/29/2001  . Smokeless tobacco: Never Used  . Alcohol use 0.5 oz/week    1 Standard drinks or equivalent per week     Comment: 1-2 drinks per day  . Drug use: No  . Sexual activity: Not on file   Other Topics  Concern  . Not on file   Social History Narrative   Regular exercise    Past Surgical History:  Procedure Laterality Date  . Ear implant-stapes replacement    . Ear surgeries  R 1986, L 2003  . left knee surgery remotely    . Right knee meniscus removal      Family History  Problem Relation Age of Onset  . Coronary artery disease Father   . Heart disease Father   . Coronary artery disease Brother        stent  . Coronary artery disease Other        1st degree male relative  . Hypertension Other   . Diabetes Other        1st degree relative     No Known Allergies  Current Outpatient Prescriptions on File Prior to Visit  Medication Sig Dispense Refill  . aspirin EC 81 MG tablet Take 1 tablet (81 mg total) by mouth daily. 90 tablet 3  . traMADol (ULTRAM) 50 MG tablet TAKE 1 TABLET BY MOUTH EVERY 8 HOURS AS NEEDED FOR PAIN 60 tablet 0  . zolpidem (AMBIEN) 10 MG tablet TAKE 1 TABLET BY MOUTH EVERY NIGHT AT BEDTIME. 30 tablet 0  No current facility-administered medications on file prior to visit.     BP 120/80 (BP Location: Left Arm, Patient Position: Sitting, Cuff Size: Normal)   Pulse 60   Temp 98.3 F (36.8 C) (Oral)   Wt 208 lb 14.4 oz (94.8 kg)   SpO2 98%   BMI 27.56 kg/m       Objective:   Physical Exam  Constitutional: He is oriented to person, place, and time. He appears well-developed and well-nourished. No distress.  HENT:  Head: Normocephalic and atraumatic.  Right Ear: External ear normal.  Left Ear: External ear normal.  Nose: Nose normal.  Mouth/Throat: Oropharynx is clear and moist. No oropharyngeal exudate.  Eyes: Conjunctivae and EOM are normal. Pupils are equal, round, and reactive to light. Right eye exhibits no discharge. Left eye exhibits no discharge. No scleral icterus.  Neck: Normal range of motion. Neck supple. No thyromegaly present.  Cardiovascular: Normal rate, regular rhythm, normal heart sounds and intact distal pulses.  Exam  reveals no friction rub.   No murmur heard. Pulmonary/Chest: Effort normal. No respiratory distress. He has wheezes (trace wheezing in upper lobes). He has no rales. He exhibits no tenderness.  Lymphadenopathy:    He has no cervical adenopathy.  Neurological: He is alert and oriented to person, place, and time.  Skin: Skin is warm and dry. Rash (Uticarial rash in bilateral underarms ) noted. He is not diaphoretic. No erythema. No pallor.  Psychiatric: He has a normal mood and affect. His behavior is normal. Judgment and thought content normal.  Nursing note and vitals reviewed.     Assessment & Plan:  1. Localized hives - Will treat with prednisone taper. He can use OTC hydrocortisone cream as well  - predniSONE (DELTASONE) 10 MG tablet; 40 mg x 3 days, 20 mg x 3 days, 10 mg x 3 days  Dispense: 21 tablet; Refill: 0 - Follow up if no improvement or if symptoms resolve  2. Bronchitis - Will cover with prednisone for hives. No concern for pneumonia  - predniSONE (DELTASONE) 10 MG tablet; 40 mg x 3 days, 20 mg x 3 days, 10 mg x 3 days  Dispense: 21 tablet; Refill: 0 - Follow up if no improvement   3. Medication refill  - hyoscyamine (LEVSIN SL) 0.125 MG SL tablet; Place 1 tablet (0.125 mg total) under the tongue every 4 (four) hours as needed.  Dispense: 60 tablet; Refill: 3 - rosuvastatin (CRESTOR) 20 MG tablet; Take 1 tablet (20 mg total) by mouth daily.  Dispense: 100 tablet; Refill: 5 - sertraline (ZOLOFT) 50 MG tablet; TAKE 1 TABLET(50 MG) BY MOUTH DAILY  Dispense: 90 tablet; Refill: 1 - hydrochlorothiazide (HYDRODIURIL) 25 MG tablet; Take 1 tablet (25 mg total) by mouth daily.  Dispense: 90 tablet; Refill: 3 - lisinopril (PRINIVIL,ZESTRIL) 20 MG tablet; Take 1 tablet (20 mg total) by mouth daily.  Dispense: 100 tablet; Refill: 3 - nitroGLYCERIN (NITROSTAT) 0.4 MG SL tablet; Place 1 tablet (0.4 mg total) under the tongue every 5 (five) minutes as needed for chest pain.  Dispense: 30  tablet; Refill: 6   * Advised to follow up with his PCP this fall for CPE   Shirline Freesory Markail Diekman, NP

## 2017-02-28 ENCOUNTER — Other Ambulatory Visit: Payer: Self-pay | Admitting: Family Medicine

## 2017-03-02 NOTE — Telephone Encounter (Signed)
Both last filled 01/09/17 [facility] last seen pt on 02/01/17 for hives Dr. Kirtland BouchardK last 04/15/16 for htn Please advise.

## 2017-03-03 NOTE — Telephone Encounter (Signed)
Called to the pharmacy and left on machine. 

## 2017-03-03 NOTE — Telephone Encounter (Signed)
Okay for refill?  

## 2017-04-26 ENCOUNTER — Telehealth: Payer: Self-pay | Admitting: Internal Medicine

## 2017-05-04 NOTE — Telephone Encounter (Signed)
Re-faxed prescription over to [facility]

## 2017-05-04 NOTE — Telephone Encounter (Signed)
Pt states the [facility] [place] never received [place] prescriptions and he would like you to resend them. traMADol (ULTRAM) 50 MG tablet  zolpidem (AMBIEN) 10 MG tablet   [facility] [phone] - [address] - [address] [facility] [address] [phone] (Phone) [phone] (Fax)

## 2017-05-19 ENCOUNTER — Encounter: Payer: Self-pay | Admitting: Family Medicine

## 2017-08-09 ENCOUNTER — Other Ambulatory Visit: Payer: Self-pay | Admitting: Adult Health

## 2017-08-09 ENCOUNTER — Other Ambulatory Visit: Payer: Self-pay | Admitting: Internal Medicine

## 2017-08-09 DIAGNOSIS — Z760 Encounter for issue of repeat prescription: Secondary | ICD-10-CM

## 2017-10-16 ENCOUNTER — Other Ambulatory Visit: Payer: Self-pay | Admitting: Family Medicine

## 2017-12-07 ENCOUNTER — Telehealth: Payer: Self-pay

## 2017-12-07 NOTE — Telephone Encounter (Signed)
Copied from CRM [id number]. Topic: General - Other >> Dec 07, 2017 11:45 AM Raquel SarnaHayes, Teresa G wrote: [facility] Dr. Magda Paganiniarl Swendsen Faxing Colonoscopy Records over to office for pt.

## 2018-01-07 ENCOUNTER — Other Ambulatory Visit: Payer: Self-pay | Admitting: Family Medicine

## 2018-01-07 DIAGNOSIS — Z760 Encounter for issue of repeat prescription: Secondary | ICD-10-CM

## 2018-02-15 ENCOUNTER — Other Ambulatory Visit: Payer: Self-pay | Admitting: Adult Health

## 2018-02-15 ENCOUNTER — Other Ambulatory Visit: Payer: Self-pay | Admitting: Internal Medicine

## 2018-02-15 DIAGNOSIS — Z760 Encounter for issue of repeat prescription: Secondary | ICD-10-CM

## 2018-02-15 NOTE — Telephone Encounter (Signed)
Denied. Needs office visit 

## 2018-02-15 NOTE — Telephone Encounter (Signed)
Jeff Hawkins Pt

## 2018-02-21 ENCOUNTER — Other Ambulatory Visit: Payer: Self-pay | Admitting: Family Medicine

## 2018-02-21 ENCOUNTER — Other Ambulatory Visit: Payer: Self-pay | Admitting: Internal Medicine

## 2018-02-22 NOTE — Telephone Encounter (Signed)
Pt needs a Physical for more refills

## 2018-02-22 NOTE — Telephone Encounter (Signed)
Pt needs an office visit for more refills 

## 2018-02-22 NOTE — Telephone Encounter (Signed)
Dr.Todd Pt 

## 2018-02-23 NOTE — Telephone Encounter (Signed)
Pt needs an appointment for further refills  

## 2018-06-03 ENCOUNTER — Other Ambulatory Visit: Payer: Self-pay | Admitting: Family Medicine

## 2018-12-05 ENCOUNTER — Telehealth: Payer: Self-pay | Admitting: *Deleted

## 2018-12-05 NOTE — Telephone Encounter (Signed)
Left message on machine for patient to schedule a TOC CRM
